# Patient Record
Sex: Female | Born: 1955 | Race: White | Hispanic: No | Smoking: Former smoker
Health system: Southern US, Community
[De-identification: ages and names within clinical notes are randomized; demographics above are authoritative.]

## PROBLEM LIST (undated history)

## (undated) DIAGNOSIS — M1990 Unspecified osteoarthritis, unspecified site: Secondary | ICD-10-CM

## (undated) DIAGNOSIS — E039 Hypothyroidism, unspecified: Secondary | ICD-10-CM

## (undated) DIAGNOSIS — I10 Essential (primary) hypertension: Secondary | ICD-10-CM

## (undated) DIAGNOSIS — M5412 Radiculopathy, cervical region: Secondary | ICD-10-CM

## (undated) DIAGNOSIS — N3281 Overactive bladder: Secondary | ICD-10-CM

## (undated) DIAGNOSIS — F39 Unspecified mood [affective] disorder: Secondary | ICD-10-CM

## (undated) DIAGNOSIS — T7840XA Allergy, unspecified, initial encounter: Secondary | ICD-10-CM

## (undated) DIAGNOSIS — J449 Chronic obstructive pulmonary disease, unspecified: Secondary | ICD-10-CM

## (undated) DIAGNOSIS — K219 Gastro-esophageal reflux disease without esophagitis: Secondary | ICD-10-CM

## (undated) DIAGNOSIS — C8590 Non-Hodgkin lymphoma, unspecified, unspecified site: Secondary | ICD-10-CM

## (undated) DIAGNOSIS — I2510 Atherosclerotic heart disease of native coronary artery without angina pectoris: Secondary | ICD-10-CM

## (undated) DIAGNOSIS — E119 Type 2 diabetes mellitus without complications: Secondary | ICD-10-CM

## (undated) DIAGNOSIS — E785 Hyperlipidemia, unspecified: Secondary | ICD-10-CM

## (undated) HISTORY — DX: Hyperlipidemia, unspecified: E78.5

## (undated) HISTORY — DX: Gastro-esophageal reflux disease without esophagitis: K21.9

## (undated) HISTORY — PX: CARDIAC CATHETERIZATION: SHX172

## (undated) HISTORY — PX: ABDOMINAL HYSTERECTOMY: SHX81

## (undated) HISTORY — DX: Unspecified mood (affective) disorder: F39

## (undated) HISTORY — DX: Hypothyroidism, unspecified: E03.9

## (undated) HISTORY — DX: Radiculopathy, cervical region: M54.12

## (undated) HISTORY — PX: LASIK: SHX215

## (undated) HISTORY — PX: CHOLECYSTECTOMY: SHX55

## (undated) HISTORY — DX: Atherosclerotic heart disease of native coronary artery without angina pectoris: I25.10

## (undated) HISTORY — DX: Unspecified osteoarthritis, unspecified site: M19.90

## (undated) HISTORY — DX: Chronic obstructive pulmonary disease, unspecified: J44.9

## (undated) HISTORY — DX: Allergy, unspecified, initial encounter: T78.40XA

## (undated) HISTORY — DX: Essential (primary) hypertension: I10

## (undated) HISTORY — PX: TRIGGER FINGER RELEASE: SHX641

## (undated) HISTORY — PX: LIPOSUCTION: SHX10

## (undated) HISTORY — DX: Overactive bladder: N32.81

## (undated) HISTORY — PX: EYE SURGERY: SHX253

---

## 2006-04-06 DIAGNOSIS — C8190 Hodgkin lymphoma, unspecified, unspecified site: Secondary | ICD-10-CM | POA: Insufficient documentation

## 2006-04-06 HISTORY — DX: Hodgkin lymphoma, unspecified, unspecified site: C81.90

## 2015-06-01 DIAGNOSIS — Z8572 Personal history of non-Hodgkin lymphomas: Secondary | ICD-10-CM | POA: Insufficient documentation

## 2015-06-01 DIAGNOSIS — M792 Neuralgia and neuritis, unspecified: Secondary | ICD-10-CM | POA: Insufficient documentation

## 2018-04-20 DIAGNOSIS — C8108 Nodular lymphocyte predominant Hodgkin lymphoma, lymph nodes of multiple sites: Secondary | ICD-10-CM | POA: Insufficient documentation

## 2020-09-03 ENCOUNTER — Ambulatory Visit: Payer: Self-pay | Admitting: Internal Medicine

## 2020-09-24 ENCOUNTER — Ambulatory Visit: Payer: Self-pay | Admitting: Adult Health

## 2020-10-14 ENCOUNTER — Other Ambulatory Visit: Payer: Self-pay | Admitting: Family Medicine

## 2020-10-14 DIAGNOSIS — Z1231 Encounter for screening mammogram for malignant neoplasm of breast: Secondary | ICD-10-CM

## 2020-12-19 ENCOUNTER — Other Ambulatory Visit: Payer: Self-pay | Admitting: Pediatrics

## 2020-12-19 ENCOUNTER — Other Ambulatory Visit: Payer: Self-pay | Admitting: Family Medicine

## 2020-12-19 DIAGNOSIS — Z1231 Encounter for screening mammogram for malignant neoplasm of breast: Secondary | ICD-10-CM

## 2021-01-02 ENCOUNTER — Other Ambulatory Visit: Payer: Self-pay | Admitting: Family Medicine

## 2021-01-02 ENCOUNTER — Ambulatory Visit
Admission: RE | Admit: 2021-01-02 | Discharge: 2021-01-02 | Disposition: A | Discharge status: Home / Self Care | Payer: Medicare Other | Source: Ambulatory Visit | Attending: Family Medicine | Admitting: Family Medicine

## 2021-01-02 DIAGNOSIS — M5412 Radiculopathy, cervical region: Secondary | ICD-10-CM

## 2021-01-02 DIAGNOSIS — M25512 Pain in left shoulder: Secondary | ICD-10-CM

## 2021-01-02 DIAGNOSIS — M25511 Pain in right shoulder: Secondary | ICD-10-CM

## 2021-01-15 ENCOUNTER — Other Ambulatory Visit: Payer: Self-pay

## 2021-01-15 ENCOUNTER — Ambulatory Visit: Payer: Medicare Other | Attending: Orthopaedic Surgery | Admitting: Physical Therapy

## 2021-01-15 ENCOUNTER — Encounter: Payer: Self-pay | Admitting: Physical Therapy

## 2021-01-15 DIAGNOSIS — M25512 Pain in left shoulder: Secondary | ICD-10-CM | POA: Diagnosis present

## 2021-01-15 DIAGNOSIS — M6281 Muscle weakness (generalized): Secondary | ICD-10-CM | POA: Insufficient documentation

## 2021-01-15 DIAGNOSIS — G8929 Other chronic pain: Secondary | ICD-10-CM | POA: Diagnosis present

## 2021-01-15 DIAGNOSIS — M25511 Pain in right shoulder: Secondary | ICD-10-CM | POA: Diagnosis present

## 2021-01-15 DIAGNOSIS — R262 Difficulty in walking, not elsewhere classified: Secondary | ICD-10-CM | POA: Diagnosis present

## 2021-01-15 DIAGNOSIS — M542 Cervicalgia: Secondary | ICD-10-CM | POA: Diagnosis not present

## 2021-01-15 NOTE — Patient Instructions (Signed)
Access Code: [email or web address] URL: [email or web address] Date: 01/15/2021 Prepared by: Rivka Barbara  Exercises Supine Shoulder Flexion with Dowel - 1 x daily - 7 x weekly - 1 sets - 10 reps - 5 second hold Seated Scapular Retraction - 1 x daily - 7 x weekly - 2 sets - 10 reps Seated Levator Scapulae Stretch - 1 x daily - 7 x weekly - 3 sets - 45 hold Seated Upper Trapezius Stretch - 1 x daily - 7 x weekly - 3 sets - 45 sec hold

## 2021-01-15 NOTE — Therapy (Addendum)
[place] MAIN [facility] SERVICES [address] [place], [place], [address] Phone: [phone]   Fax:  [phone]  Physical Therapy Evaluation  Patient Details  Name: [place] Floyd MRN: [id number] Date of Birth: Aug 22, 1955 Referring Provider (PT): Starling Manns, MD   Encounter Date: 01/15/2021   PT End of Session - 01/15/21 1351     Visit Number 1    Number of Visits 17    Date for PT Re-Evaluation 03/26/21    Authorization Time Period 01/15/21-03/26/21    Progress Note Due on Visit 10    PT Start Time 1303    PT Stop Time 1358    PT Time Calculation (min) 55 min    Activity Tolerance Patient tolerated treatment well;No increased pain             History reviewed. No pertinent past medical history.  History reviewed. No pertinent surgical history.  There were no vitals filed for this visit.    Subjective Assessment - 01/15/21 1305     Subjective Pt reports pain in her neck and back radiating into her shoulders bilaterally. Pt reports she had MRI in December and she was told she had a torn rotator cuff and was again told later by different MD she did not have rotator cuff tear. Pt reports she normally sleeps with right arm under pillow and this causes pain now, pt also reports she has difficulty getting objects out of shelves that are high due to pain. Pt reports her husbend recently passed away following 36 years of marriage. 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    Patient is accompained by: Family member    Pertinent History Pt reports pain in her neck and back radiating into her shoulders bilaterally. Pt reports she had MRI in December and she was told she had a torn rotator cuff and was again told later by different MD she did not have rotator cuff tear. Pt reports she normally sleeps with right arm under pillow and this causes pain now, pt also reports she has difficulty  getting objects out of shelves that are high due to pain. Pt reports her husbend recently passed away following 33 years of marriage. 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    Limitations Other (comment)   computer use for longer periods causes increase in pain   How long can you sit comfortably? 1 hour (computer use)    Currently in Pain? Yes    Pain Location Neck               [facility] PT Assessment - 01/22/21 0001       Assessment   Medical Diagnosis Neck Pain    Referring Provider (PT) Starling Manns, MD    Onset Date/Surgical Date 08/05/03    Hand Dominance Right      Balance Screen   Has the patient fallen in the past 6 months No    Has the patient had a decrease in activity level because of a fear of falling?  No    Is the patient reluctant to leave their home because of a fear of falling?  No      Observation/Other Assessments   Focus on Therapeutic Outcomes (FOTO)  50      AROM   Right Shoulder Flexion 135 Degrees  Right Shoulder ABduction 127 Degrees    Left Shoulder Flexion 138 Degrees    Left Shoulder ABduction 140 Degrees      Strength   Right Shoulder Flexion 4/5    Right Shoulder ABduction 4/5    Right Shoulder Internal Rotation 4+/5    Right Shoulder External Rotation 4/5    Left Shoulder Flexion 4/5    Left Shoulder Extension 4+/5    Left Shoulder ABduction 4/5    Left Shoulder Internal Rotation 4+/5    Left Shoulder External Rotation 4/5    Right Elbow Flexion 5/5    Right Elbow Extension 5/5    Left Elbow Flexion 5/5    Left Elbow Extension 5/5      Flexibility   Soft Tissue Assessment /Muscle Length no               Further assessment notes: Patient had moderate to severe trigger points in levator scapula, upper traps, and suboccipital muscles bilaterally.  Did not report any improvement in signs and symptoms with cervical distraction, however patient is finishing up steroid pack  and this may be influencing her levels of pain at this time.  Patient did not report any pain levels coming into therapy but did have some discomfort with palpation of trigger points as well as with shoulder range of motion as demonstrated in this exam.   Therex: Bilateral upper trap stretch x45 seconds each side Cues for proper positioning  Bilateral levator scapula stretch x45 seconds each side -Used to prepositioning and hold times  Active assisted range of motion using wand for flexion in supine position 10 times for 5-second holds cues for proper positioning and to ensure patient was not pushing into levels of pain -Patient reports improved shoulder range of motion following this exercise  Scapular retraction 2 x 10 x 5-second holds -In order to improve patient sitting posture and tolerance for sitting          Objective measurements completed on examination: See above findings.                PT Education - 01/15/21 1351     Education Details Access Code: [email or web address]  URL: [email or web address]  Date: 01/15/2021  Prepared by: Rivka Barbara    Exercises  Supine Shoulder Flexion with Dowel - 1 x daily - 7 x weekly - 1 sets - 10 reps - 5 second hold  Seated Scapular Retraction - 1 x daily - 7 x weekly - 2 sets - 10 reps  Seated Levator Scapulae Stretch - 1 x daily - 7 x weekly - 3 sets - 45 hold  Seated Upper Trapezius Stretch - 1 x daily - 7 x weekly - 3 sets - 45 sec hold    Person(s) Educated Patient    Methods Explanation;Handout    Comprehension Verbalized understanding;Returned demonstration              PT Short Term Goals - 01/15/21 1645       PT SHORT TERM GOAL #1   Title Patient will improve shoulder AROM to > 140 degrees of flexion, scaption, and abduction for improved ability to perform overhead activities.    Baseline See flowsheet    Time 4    Period Weeks    Status New    Target Date 02/12/21      PT SHORT TERM GOAL #2    Title Patient will increase FOTO score to equal to or greater than 54    to  demonstrate statistically significant improvement in mobility and quality of life.    Baseline 50 at IE    Time 4    Period Weeks    Target Date 02/12/21               PT Long Term Goals - 01/15/21 1648       PT LONG TERM GOAL #1   Title Patient will increase FOTO score to equal to or greater than  58   to demonstrate statistically significant improvement in mobility and quality of life.    Baseline 50    Time 8    Period Weeks    Status New    Target Date 02/12/21      PT LONG TERM GOAL #2   Title Pt will report neck and shoudler pain no greater than 2/10 for 1 week in order to indicate improved pain levels    Baseline Patient has moderate pain with sitting at computer for longer than an hour and frequently has to change positions.    Time 8    Period Weeks    Status New    Target Date 03/12/21      PT LONG TERM GOAL #3   Title Patient will report ability to perform all housework involving overhead reaching in order to improve her function with ADLs    Baseline And ability to perform housework involving overhead reaching above approximately 130 degrees of shoulder flexion    Time 8    Period Weeks    Status New                    Plan - 01/15/21 1353     Clinical Impression Statement Pt is 65 y.o. female who presents to physical therapy with c/o neck and shoulder pain. Pt reports neck pain occassionally radiates into th eback of her head causing headaches. Pt has deficits in shoulder range of motion and strength bilaterally and has deficits in neck range of motion with sidebending in additions to rounded shoulder posture. Pt also has numerous trigger points in her upper traps, levator scapulae and subocciptal muscles. Pt has acivity limitations in her ability to work on or use her copmuter for more than an hour or complete other activities involving prolonged sitting posture. Pt will  benefit from skilled physical therapy intervention in order to improve her shoudler range of motion and strength, and to improve her neck pain in order to improve her function and her quality of life.    Personal Factors and Comorbidities Age;Time since onset of injury/illness/exacerbation    Examination-Activity Limitations Reach Overhead;Lift;Sit    Examination-Participation Restrictions Driving;Other   Computer work   [employer] Stable/Uncomplicated    Clinical Decision Making Low    Rehab Potential Good    PT Frequency 2x / week    PT Duration Other (comment)   10 weeks   PT Treatment/Interventions ADLs/Self Care Home Management;Therapeutic activities;Therapeutic exercise;Neuromuscular re-education;Dry needling;Passive range of motion;Joint Manipulations;Spinal Manipulations;Manual techniques    PT Next Visit Plan Review HEP. manual therapy to imrove UT, levator and subocippital muscular trigger points    PT Home Exercise Plan Access Code: [email or web address]  URL: [email or web address]             Patient will benefit from skilled therapeutic intervention in order to improve the following deficits and impairments:  Decreased activity tolerance, Impaired flexibility, Impaired UE functional use, Improper body mechanics, Postural dysfunction, Pain  Visit Diagnosis: Cervicalgia - Plan: PT plan  of care cert/re-cert  Chronic right shoulder pain - Plan: PT plan of care cert/re-cert  Chronic left shoulder pain - Plan: PT plan of care cert/re-cert     Problem List There are no problems to display for this patient.   Particia Lather, PT 01/15/2021, 5:03 PM  [place] MAIN [facility] SERVICES [address] [place], [place], [address] Phone: [phone]   Fax:  [phone]  Name: Virginia Floyd MRN: [id number] Date of Birth: 30-May-1955

## 2021-01-20 ENCOUNTER — Other Ambulatory Visit: Payer: Self-pay

## 2021-01-20 ENCOUNTER — Ambulatory Visit: Payer: Medicare Other

## 2021-01-20 DIAGNOSIS — M542 Cervicalgia: Secondary | ICD-10-CM

## 2021-01-20 DIAGNOSIS — M25512 Pain in left shoulder: Secondary | ICD-10-CM

## 2021-01-20 DIAGNOSIS — M25511 Pain in right shoulder: Secondary | ICD-10-CM

## 2021-01-20 DIAGNOSIS — G8929 Other chronic pain: Secondary | ICD-10-CM

## 2021-01-20 NOTE — Therapy (Signed)
[place] MAIN [facility] SERVICES [address] [place], [place], [address] Phone: [phone]   Fax:  [phone]  Physical Therapy Treatment  Patient Details  Name: [place] Floyd MRN: [id number] Date of Birth: 17-May-1955 Referring Provider (PT): Starling Manns, MD   Encounter Date: 01/20/2021   PT End of Session - 01/20/21 1319     Visit Number 2    Number of Visits 17    Date for PT Re-Evaluation 03/26/21    Authorization Type Medicare    Authorization Time Period 01/15/21-03/26/21    PT Start Time 1302    PT Stop Time 1342    PT Time Calculation (min) 40 min    Activity Tolerance Patient tolerated treatment well;No increased pain             No past medical history on file.  No past surgical history on file.  There were no vitals filed for this visit.   Subjective Assessment - 01/20/21 1307     Subjective Pt doing well today, had a busy weekend, company in from far away, but did not notice any significant imcrease in her pain. She did not have time to attempt her HEP.    Pertinent History Pt reports pain in her neck and back radiating into her shoulders bilaterally. Pt reports she had MRI in December and she was told she had a torn rotator cuff and was again told later by different MD she did not have rotator cuff tear. Pt reports she normally sleeps with right arm under pillow and this causes pain now, pt also reports she has difficulty getting objects out of shelves that are high due to pain. Pt reports her husband recently passed away following 82 years of marriage. 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    Currently in Pain? Yes    Pain Score 3     Pain Location --   bilat cervical extensors C4-C6 level             INTERVENTION -Supine wand flexion on wedge 1x20x3sec H No pain, excellent ROM -Cervical lateral flexion stretch  DC'd due to hypermobility in ROM  and no subjective tissue stretch  -Cervical rotation stretch  DC'd due to hypermobility in ROM and no subjective tissue stretch  -seated nose to armpit stretch 2x30sec bilat (subjective stretch just of posterior rib 1, no pain)  -seated scapular retraction isometrics, 15x5secH, cues for neutral head/neck  -seated BUE GHJ ER elbows at 90 degrees, hands supinated 1x15x3secH (tied to Rt hand due to grip weakness) -standing BUE shoulder ABD to 90 1lb FW each 1x15 -standing BUE shoulder flexion to 90 1lb FW each   -seated BUE GHJ ER elbows at 90 degrees, hands supinated 1x15x3secH (tied to Rt hand due to grip weakness) -standing BUE shoulder ABD to 90 1lb FW each 1x15 -standing BUE shoulder flexion to 90 1lb FW each (moved to scaption for this set with successful reduction of left GHJ joint noise)   -standing back to wall, cervication retraction into 1 pillow 15x3secH     PT Education - 01/20/21 1318     Education Details Form for therex this date.    Person(s) Educated Patient    Methods Explanation;Demonstration    Comprehension Verbalized understanding              PT Short Term Goals - 01/15/21 1645  PT SHORT TERM GOAL #1   Title Patient will improve shoulder AROM to > 140 degrees of flexion, scaption, and abduction for improved ability to perform overhead activities.    Baseline See flowsheet    Time 4    Period Weeks    Status New    Target Date 02/12/21      PT SHORT TERM GOAL #2   Title Patient will increase FOTO score to equal to or greater than 54    to demonstrate statistically significant improvement in mobility and quality of life.    Baseline 50 at IE    Time 4    Period Weeks    Target Date 02/12/21               PT Long Term Goals - 01/15/21 1648       PT LONG TERM GOAL #1   Title Patient will increase FOTO score to equal to or greater than  58   to demonstrate statistically significant improvement in mobility and quality of life.     Baseline 50    Time 8    Period Weeks    Status New    Target Date 02/12/21      PT LONG TERM GOAL #2   Title Pt will report neck and shoudler pain no greater than 2/10 for 1 week in order to indicate improved pain levels    Baseline Patient has moderate pain with sitting at computer for longer than an hour and frequently has to change positions.    Time 8    Period Weeks    Status New    Target Date 03/12/21      PT LONG TERM GOAL #3   Title Patient will report ability to perform all housework involving overhead reaching in order to improve her function with ADLs    Baseline And ability to perform housework involving overhead reaching above approximately 130 degrees of shoulder flexion    Time 8    Period Weeks    Status New                   Plan - 01/20/21 1320     Clinical Impression Statement Continued working toward gentle cervical soft tissue work and shoulder motor activation. All interventions tolerated as expected by author. Mobility and strength continue to progress as anticipated. Recovery intervals given as needed based on signs of exertion and/or pt request. Pt educated on best technique for each intervention- author uses verbal, visual, tactile cues to optimize learning. Author takes steps to maximize patient independence when appropriate. Pt remains highly motivated. Pt closely monitored throughout session for safe activity response, as well as to maximize patient safety during interventions. The patient's therapy prognosis indicates continued potential for improvement, anticipate that future progress is attainable in a reasonable/predictable timeframe. Maximum improvement is within reach. Pt will continue to benefit from skilled PT services to address deficits and impairment identified in evaluation in order to maximize independence and safety in basic mobility required for performance of ADL, IADL, and leisure.    Personal Factors and Comorbidities Comorbidity 1     Examination-Activity Limitations Reach Overhead;Lift;Sit    Examination-Participation Restrictions Driving;Other    Stability/Clinical Decision Making Stable/Uncomplicated    Clinical Decision Making Low    Rehab Potential Good    PT Frequency 2x / week    PT Duration Other (comment)   10 weeks   PT Treatment/Interventions ADLs/Self Care Home Management;Therapeutic activities;Therapeutic exercise;Neuromuscular re-education;Dry needling;Passive  range of motion;Joint Manipulations;Spinal Manipulations;Manual techniques    PT Next Visit Plan Reassess suboccipitals, scapular retraction to mobilize lower cervical spine, band row, continue with shoulder   PT Home Exercise Plan Access Code: [email or web address]  URL: [email or web address]    Consulted and Agree with Plan of Care Patient             Patient will benefit from skilled therapeutic intervention in order to improve the following deficits and impairments:  Decreased activity tolerance, Impaired flexibility, Impaired UE functional use, Improper body mechanics, Postural dysfunction, Pain  Visit Diagnosis: Cervicalgia  Chronic right shoulder pain  Chronic left shoulder pain     Problem List There are no problems to display for this patient.  1:36 PM, 01/20/21 Etta Grandchild, PT, DPT Physical Therapist - [facility]  Outpatient Physical Therapy- [place] [phone]     [place], [place] 01/20/2021, 1:24 PM  [place] MAIN [facility] SERVICES [address] [place], [place], [address] Phone: [phone]   Fax:  [phone]  Name: Etherine Mackowiak MRN: [id number] Date of Birth: 1955-12-04

## 2021-01-22 ENCOUNTER — Ambulatory Visit
Admission: RE | Admit: 2021-01-22 | Discharge: 2021-01-22 | Disposition: A | Discharge status: Home / Self Care | Payer: Medicare Other | Source: Ambulatory Visit | Attending: Family Medicine | Admitting: Family Medicine

## 2021-01-22 ENCOUNTER — Other Ambulatory Visit: Payer: Self-pay

## 2021-01-22 DIAGNOSIS — Z1231 Encounter for screening mammogram for malignant neoplasm of breast: Secondary | ICD-10-CM

## 2021-01-23 ENCOUNTER — Ambulatory Visit: Payer: Medicare Other | Admitting: Physical Therapy

## 2021-01-23 DIAGNOSIS — M542 Cervicalgia: Secondary | ICD-10-CM

## 2021-01-23 DIAGNOSIS — G8929 Other chronic pain: Secondary | ICD-10-CM

## 2021-01-23 DIAGNOSIS — M25512 Pain in left shoulder: Secondary | ICD-10-CM

## 2021-01-23 NOTE — Therapy (Signed)
[place] MAIN [facility] SERVICES [address] [place], [place], [address] Phone: [phone]   Fax:  [phone]  Physical Therapy Treatment  Patient Details  Name: [place] Floyd MRN: [id number] Date of Birth: 05-31-1955 Referring Provider (PT): Starling Manns, MD   Encounter Date: 01/23/2021   PT End of Session - 01/23/21 1507     Visit Number 3    Number of Visits 17    Date for PT Re-Evaluation 03/26/21    Authorization Type Medicare    Authorization Time Period 01/15/21-03/26/21    PT Start Time 1510    PT Stop Time 1550    PT Time Calculation (min) 40 min    Activity Tolerance Patient tolerated treatment well;No increased pain             No past medical history on file.  No past surgical history on file.  There were no vitals filed for this visit.   Subjective Assessment - 01/23/21 1507     Subjective Pt reports increased s/s of headache from her last session. Reports she has tried some of her HEP.    Pertinent History Pt reports pain in her neck and back radiating into her shoulders bilaterally. Pt reports she had MRI in December and she was told she had a torn rotator cuff and was again told later by different MD she did not have rotator cuff tear. Pt reports she normally sleeps with right arm under pillow and this causes pain now, pt also reports she has difficulty getting objects out of shelves that are high due to pain. Pt reports her husband recently passed away following 37 years of marriage. 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    How long can you sit comfortably? 1 hour (computer use)                    Treatment provided this session  Therex:   Cervical retraction utilizing BP cuff for biofeedback of correct muscle activation, pt able to consistently achieve 10-15 mmhg on BP cuff -BP cuff pumped to inflation and rolled  -2x10x5sec -To improve  cervical flexor strength for posture activities  Shoulder flexion in supine with 1# hand weight to improve shoulder strength and mobility 1 x 10 bilateral  1 x 10 with YTB -rated easy   Bilateral shoulder extension -2 x 10, 1st set RTB, second set GTB -To improve shoulder strength and mobility  Bilateral hor shoulder ABD  -RTB 2 x 10  -cues for hand and shoulder placement  -To target mid trap strength and thoracic extensor strength.  TRX Rows: 2 x 10  -cues for foot placement and distance from anchor point  -cues for proper posture in the shoulders and neck      Manual Therapy:  STM to subocippitals, numerous trigger points noted  Suboccipital release 2 x 1 min ea -subjective report of decresed headache s/s following  -Patient had no complaints of headache following manual therapy and physical therapy session this date   Pt educated throughout session about proper posture and technique with exercises. Improved exercise technique, movement at target joints, use of target muscles after min to mod verbal, visual, tactile cues.                     PT Education - 01/23/21 1654     Education Details Added weighted shoulder flexion with  1 pound shoulder weight, shoulder extensions, horizontal shoulder abduction, to patient's home exercise program via handout    Person(s) Educated Patient    Methods Explanation;Handout    Comprehension Verbalized understanding;Returned demonstration              PT Short Term Goals - 01/15/21 1645       PT SHORT TERM GOAL #1   Title Patient will improve shoulder AROM to > 140 degrees of flexion, scaption, and abduction for improved ability to perform overhead activities.    Baseline See flowsheet    Time 4    Period Weeks    Status New    Target Date 02/12/21      PT SHORT TERM GOAL #2   Title Patient will increase FOTO score to equal to or greater than 54    to demonstrate statistically significant improvement in  mobility and quality of life.    Baseline 50 at IE    Time 4    Period Weeks    Target Date 02/12/21               PT Long Term Goals - 01/15/21 1648       PT LONG TERM GOAL #1   Title Patient will increase FOTO score to equal to or greater than  58   to demonstrate statistically significant improvement in mobility and quality of life.    Baseline 50    Time 8    Period Weeks    Status New    Target Date 02/12/21      PT LONG TERM GOAL #2   Title Pt will report neck and shoudler pain no greater than 2/10 for 1 week in order to indicate improved pain levels    Baseline Patient has moderate pain with sitting at computer for longer than an hour and frequently has to change positions.    Time 8    Period Weeks    Status New    Target Date 03/12/21      PT LONG TERM GOAL #3   Title Patient will report ability to perform all housework involving overhead reaching in order to improve her function with ADLs    Baseline And ability to perform housework involving overhead reaching above approximately 130 degrees of shoulder flexion    Time 8    Period Weeks    Status New                   Plan - 01/23/21 1508     Clinical Impression Statement Patient continues to demonstrate good motivation to complete home exercise program and physical therapy program on this date.  Continue with the shoulder strengthening exercises, postural strengthening exercises, in order to improve patient's levels of pain and discomfort.  Patient did report decreased levels of headache following manual therapy exercises performed during physical therapy session today.    Personal Factors and Comorbidities Comorbidity 1    Examination-Activity Limitations Reach Overhead;Lift;Sit    Examination-Participation Restrictions Driving;Other    Stability/Clinical Decision Making Stable/Uncomplicated    Rehab Potential Good    PT Frequency 2x / week    PT Duration Other (comment)   10 weeks   PT  Treatment/Interventions ADLs/Self Care Home Management;Therapeutic activities;Therapeutic exercise;Neuromuscular re-education;Dry needling;Passive range of motion;Joint Manipulations;Spinal Manipulations;Manual techniques    PT Next Visit Plan Review HEP. manual therapy to imrove UT, levator and subocippital muscular trigger points    PT Home Exercise Plan Access Code: [email or web address]  URL:  [email or web address]    Consulted and Agree with Plan of Care Patient             Patient will benefit from skilled therapeutic intervention in order to improve the following deficits and impairments:  Decreased activity tolerance, Impaired flexibility, Impaired UE functional use, Improper body mechanics, Postural dysfunction, Pain  Visit Diagnosis: No diagnosis found.     Problem List There are no problems to display for this patient.   Particia Lather, PT 01/23/2021, 5:06 PM  [place] MAIN [facility] SERVICES [address] [place], [place], [address] Phone: [phone]   Fax:  [phone]  Name: Nataleah Scioneaux MRN: [id number] Date of Birth: [birth year]

## 2021-01-27 ENCOUNTER — Other Ambulatory Visit: Payer: Self-pay

## 2021-01-27 ENCOUNTER — Ambulatory Visit: Payer: Medicare Other | Admitting: Physical Therapy

## 2021-01-27 DIAGNOSIS — M542 Cervicalgia: Secondary | ICD-10-CM | POA: Diagnosis not present

## 2021-01-27 DIAGNOSIS — R262 Difficulty in walking, not elsewhere classified: Secondary | ICD-10-CM

## 2021-01-27 DIAGNOSIS — G8929 Other chronic pain: Secondary | ICD-10-CM

## 2021-01-27 DIAGNOSIS — M6281 Muscle weakness (generalized): Secondary | ICD-10-CM

## 2021-01-27 NOTE — Therapy (Signed)
[place] MAIN [facility] SERVICES [address] [place], [place], [address] Phone: [phone]   Fax:  [phone]  Physical Therapy Treatment  Patient Details  Name: Virginia Floyd MRN: [id number] Date of Birth: 12-Sep-1955 Referring Provider (PT): Starling Manns, MD   Encounter Date: 01/27/2021   PT End of Session - 01/27/21 1022     Visit Number 4    Number of Visits 17    Date for PT Re-Evaluation 03/26/21    Authorization Type Medicare    Authorization Time Period 01/15/21-03/26/21    PT Start Time 1018    PT Stop Time 1100    PT Time Calculation (min) 42 min    Activity Tolerance Patient tolerated treatment well;No increased pain             No past medical history on file.  No past surgical history on file.  There were no vitals filed for this visit.   Subjective Assessment - 01/27/21 1021     Subjective Pt reports she has had some headaches off and on since last visit.  She notes she has been battling a cold since Friday, so she was unable to perform her HEP.    Pertinent History Pt reports pain in her neck and back radiating into her shoulders bilaterally. Pt reports she had MRI in December and she was told she had a torn rotator cuff and was again told later by different MD she did not have rotator cuff tear. Pt reports she normally sleeps with right arm under pillow and this causes pain now, pt also reports she has difficulty getting objects out of shelves that are high due to pain. Pt reports her husband recently passed away following 18 years of marriage. 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    How long can you sit comfortably? 1 hour (computer use)    Currently in Pain? Yes    Pain Score 3     Pain Location Neck    Pain Orientation Left               Therex:    Cervical retraction into pillow with verbal cues for correct muscle  activation -2x10x5sec -To improve cervical flexor strength for posture activities   Shoulder flexion in supine with RTB to improve shoulder strength and mobility 2x10 bilateral     Rhythmic Stabilization progressing to distal forces being applied, 60 sec bouts Rhythmic stabilization with eyes closed,     Bilateral shoulder extension - 2x10, 1st set RTB, second set GTB -To improve shoulder strength and mobility   Bilateral shoulder ABD  -RTB 2x10  -cues for hand and shoulder placement  -To target mid trap strength and thoracic extensor strength.  Bilateral rhythmic stabilization in forward flexion/abduction -RTB, 30 sec bouts -cues for technique and performance of exercise   TRX Rows: 2 x 10  -cues for foot placement and distance from anchor point  -cues for proper posture in the shoulders and neck      Manual Therapy:   STM to subocippitals, numerous trigger points noted   Suboccipital release 2x3 min ea -pt reports reduction in painful region following treatment  Supine UT stretch applied bilaterally, 30 sec bouts x2 each side Supine cervical rotation with slight overpressure applied by therapist, 30 sec bouts, x2 each side    Pt educated throughout session about proper posture and technique with exercises. Improved exercise  technique, movement at target joints, use of target muscles after min to mod verbal, visual, tactile cues.          PT Short Term Goals - 01/15/21 1645       PT SHORT TERM GOAL #1   Title Patient will improve shoulder AROM to > 140 degrees of flexion, scaption, and abduction for improved ability to perform overhead activities.    Baseline See flowsheet    Time 4    Period Weeks    Status New    Target Date 02/12/21      PT SHORT TERM GOAL #2   Title Patient will increase FOTO score to equal to or greater than 54    to demonstrate statistically significant improvement in mobility and quality of life.    Baseline 50 at IE    Time 4     Period Weeks    Target Date 02/12/21               PT Long Term Goals - 01/15/21 1648       PT LONG TERM GOAL #1   Title Patient will increase FOTO score to equal to or greater than  58   to demonstrate statistically significant improvement in mobility and quality of life.    Baseline 50    Time 8    Period Weeks    Status New    Target Date 02/12/21      PT LONG TERM GOAL #2   Title Pt will report neck and shoudler pain no greater than 2/10 for 1 week in order to indicate improved pain levels    Baseline Patient has moderate pain with sitting at computer for longer than an hour and frequently has to change positions.    Time 8    Period Weeks    Status New    Target Date 03/12/21      PT LONG TERM GOAL #3   Title Patient will report ability to perform all housework involving overhead reaching in order to improve her function with ADLs    Baseline And ability to perform housework involving overhead reaching above approximately 130 degrees of shoulder flexion    Time 8    Period Weeks    Status New                    Patient will benefit from skilled therapeutic intervention in order to improve the following deficits and impairments:     Visit Diagnosis: Difficulty in walking, not elsewhere classified  Muscle weakness (generalized)  Cervicalgia  Chronic left shoulder pain  Chronic right shoulder pain     Problem List There are no problems to display for this patient.   Gwenlyn Saran, PT, DPT 01/27/21, 1:50 PM   Christie Nottingham, PT 01/27/2021, 10:22 AM  [place] MAIN [facility] SERVICES [address] [place], [place], [address] Phone: [phone]   Fax:  [phone]  Name: Virginia Floyd MRN: [id number] Date of Birth: 10-Aug-1955

## 2021-01-29 ENCOUNTER — Ambulatory Visit: Payer: Medicare Other | Admitting: Physical Therapy

## 2021-01-29 ENCOUNTER — Other Ambulatory Visit: Payer: Self-pay

## 2021-01-29 DIAGNOSIS — M542 Cervicalgia: Secondary | ICD-10-CM | POA: Diagnosis not present

## 2021-01-29 DIAGNOSIS — M6281 Muscle weakness (generalized): Secondary | ICD-10-CM

## 2021-01-29 DIAGNOSIS — M25512 Pain in left shoulder: Secondary | ICD-10-CM

## 2021-01-29 DIAGNOSIS — G8929 Other chronic pain: Secondary | ICD-10-CM

## 2021-01-29 NOTE — Therapy (Signed)
[place] MAIN [facility] SERVICES [address] [place], [place], [address] Phone: [phone]   Fax:  [phone]  Physical Therapy Treatment  Patient Details  Name: Virginia Floyd MRN: [id number] Date of Birth: April 04, 1956 Referring Provider (PT): Starling Manns, MD   Encounter Date: 01/29/2021   PT End of Session - 01/29/21 1221     Visit Number 5    Number of Visits 17    Date for PT Re-Evaluation 03/26/21    Authorization Type Medicare    Authorization Time Period 01/15/21-03/26/21    Progress Note Due on Visit 10    PT Start Time 1015    PT Stop Time 1058    PT Time Calculation (min) 43 min    Activity Tolerance Patient tolerated treatment well;No increased pain    Behavior During Therapy [facility] for tasks assessed/performed             No past medical history on file.  No past surgical history on file.  There were no vitals filed for this visit.   Subjective Assessment - 01/29/21 1019     Subjective Pt reports continued copmlaints of headache and has been unable to notice any patterns to her headaches. Pt reports improvement in shoulder function but still has difficulty with sleeping on either side and inability to lay hand under pillow.    Patient is accompained by: Family member    Pertinent History Pt reports pain in her neck and back radiating into her shoulders bilaterally. Pt reports she had MRI in December and she was told she had a torn rotator cuff and was again told later by different MD she did not have rotator cuff tear. Pt reports she normally sleeps with right arm under pillow and this causes pain now, pt also reports she has difficulty getting objects out of shelves that are high due to pain. Pt reports her husband recently passed away following 9 years of marriage. 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    Limitations Other (comment)    How long can you sit  comfortably? 1 hour (computer use)            Treatment provided this session      Therex:    Cervical retraction into BP cuff for feedback on correct movement mechanics,  -2x 10 x 5 sec   Rhythmic Stabilization progressing to distal forces being applied, 60 sec bouts Rhythmic stabilization with eyes closed,       Bilateral shoulder extension - 2x10, 1st set RTB, second set GTB -To improve shoulder strength and mobility   Bilateral shoulder ABD  -GTB 2x10  -cues for hand and shoulder placement  -To target mid trap strength and thoracic extensor strength.   TRX Rows: 2 x 10  -cues for foot placement and distance from anchor point  -cues for proper posture in the shoulders and neck      Manual Therapy:   STM to subocippitals, numerous trigger points noted   Suboccipital release 2x3 min ea -pt reports reduction in painful region following treatment   -Left upper trap release 2 x 1 minute, patient noted improvement in left upper trap pain with palpation following with upper trap release  Suboccipital and bilateral upper trap trigger point releases as well as soft tissue mobilization applied.   Pt educated throughout session about proper posture and technique with exercises. Improved exercise technique, movement at  target joints, use of target muscles after min to mod verbal, visual, tactile cues                                PT Short Term Goals - 01/15/21 1645       PT SHORT TERM GOAL #1   Title Patient will improve shoulder AROM to > 140 degrees of flexion, scaption, and abduction for improved ability to perform overhead activities.    Baseline See flowsheet    Time 4    Period Weeks    Status New    Target Date 02/12/21      PT SHORT TERM GOAL #2   Title Patient will increase FOTO score to equal to or greater than 54    to demonstrate statistically significant improvement in mobility and quality of life.    Baseline 50 at IE     Time 4    Period Weeks    Target Date 02/12/21               PT Long Term Goals - 01/15/21 1648       PT LONG TERM GOAL #1   Title Patient will increase FOTO score to equal to or greater than  58   to demonstrate statistically significant improvement in mobility and quality of life.    Baseline 50    Time 8    Period Weeks    Status New    Target Date 02/12/21      PT LONG TERM GOAL #2   Title Pt will report neck and shoudler pain no greater than 2/10 for 1 week in order to indicate improved pain levels    Baseline Patient has moderate pain with sitting at computer for longer than an hour and frequently has to change positions.    Time 8    Period Weeks    Status New    Target Date 03/12/21      PT LONG TERM GOAL #3   Title Patient will report ability to perform all housework involving overhead reaching in order to improve her function with ADLs    Baseline And ability to perform housework involving overhead reaching above approximately 130 degrees of shoulder flexion    Time 8    Period Weeks    Status New                   Plan - 01/29/21 1216     Clinical Impression Statement Patient tolerated session well with manual therapy as well as with its continuation of shoulder strengthening stabilization exercises.  Patient still has some tenderness in the suboccipital region as well as in her bilateral upper traps but patient continues to note improvement in symptoms following manual therapy.  Patient to be reassessed next session for improvement in intermittent headaches as well as improvement in right upper trap and suboccipital muscles with tenderness and pain.  Patient continue to benefit from skilled physical therapy intervention in order to improve her deficits in order to allow her to return to her prior level of function.    Personal Factors and Comorbidities Comorbidity 1    Examination-Activity Limitations Reach Overhead;Lift;Sit     Examination-Participation Restrictions Driving;Other    Stability/Clinical Decision Making Stable/Uncomplicated    Rehab Potential Good    PT Frequency 2x / week    PT Duration Other (comment)   10 weeks   PT Treatment/Interventions ADLs/Self  Care Home Management;Therapeutic activities;Therapeutic exercise;Neuromuscular re-education;Dry needling;Passive range of motion;Joint Manipulations;Spinal Manipulations;Manual techniques    PT Next Visit Plan Review HEP. manual therapy to imrove UT, levator and subocippital muscular trigger points    PT Home Exercise Plan Access Code: [email or web address]  URL: [email or web address]    Consulted and Agree with Plan of Care Patient             Patient will benefit from skilled therapeutic intervention in order to improve the following deficits and impairments:  Decreased activity tolerance, Impaired flexibility, Impaired UE functional use, Improper body mechanics, Postural dysfunction, Pain  Visit Diagnosis: Cervicalgia  Chronic left shoulder pain  Chronic right shoulder pain  Muscle weakness (generalized)     Problem List There are no problems to display for this patient.   Particia Lather, PT 01/29/2021, 12:22 PM  [place] MAIN [facility] SERVICES [address] [place], [place], [address] Phone: [phone]   Fax:  [phone]  Name: Virginia Floyd MRN: [id number] Date of Birth: July 31, 1955

## 2021-02-03 ENCOUNTER — Ambulatory Visit: Payer: Medicare Other

## 2021-02-03 ENCOUNTER — Other Ambulatory Visit: Payer: Self-pay

## 2021-02-03 DIAGNOSIS — M542 Cervicalgia: Secondary | ICD-10-CM

## 2021-02-03 DIAGNOSIS — M6281 Muscle weakness (generalized): Secondary | ICD-10-CM

## 2021-02-03 NOTE — Therapy (Signed)
[place] MAIN [facility] SERVICES [address] [place], [place], [address] Phone: [phone]   Fax:  [phone]  Physical Therapy Treatment  Patient Details  Name: Virginia Floyd MRN: [id number] Date of Birth: 15-May-1955 Referring Provider (PT): Starling Manns, MD   Encounter Date: 02/03/2021   PT End of Session - 02/03/21 0919     Visit Number 6    Number of Visits 17    Date for PT Re-Evaluation 03/26/21    Authorization Type Medicare    Authorization Time Period 01/15/21-03/26/21    Progress Note Due on Visit 10    PT Start Time 0922    PT Stop Time 1006    PT Time Calculation (min) 44 min    Activity Tolerance Patient tolerated treatment well;No increased pain    Behavior During Therapy [facility] for tasks assessed/performed             History reviewed. No pertinent past medical history.  History reviewed. No pertinent surgical history.  There were no vitals filed for this visit.   Subjective Assessment - 02/03/21 0920     Subjective Patient reports doing well since last visit, only had 2 or 3 headaches since last visit which she rated as about 4/10. Denies current headache or pain.    Patient is accompained by: Family member    Pertinent History Pt reports pain in her neck and back radiating into her shoulders bilaterally. Pt reports she had MRI in December and she was told she had a torn rotator cuff and was again told later by different MD she did not have rotator cuff tear. Pt reports she normally sleeps with right arm under pillow and this causes pain now, pt also reports she has difficulty getting objects out of shelves that are high due to pain. Pt reports her husband recently passed away following 25 years of marriage. 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    Limitations Other (comment)    How long can you sit comfortably? 1 hour (computer use)    Currently in Pain?  No/denies    Pain Score 0-No pain                  Manual Therapy   Upper trap stretch 2x30sec each side suboccipital release  cervical side bend and rotation stretch 2x30sec each side  Heat pack with active cervical rotation and side bending while SPT provided shoulder stabilization and oscillations.  Ther Ex: Cervical retraction, 1x12, cuing for retraction instead of flexion and chin tuck.  Scapular clocks 2x12, cuing for focus on the scapular retraction and depression with 2-3 sec hold at end.  Shoulder extension GTB, 2x12  BIL ER, RTB 2x12  BIL shoulder horizontal abduction, RTB, 2x12  Ys with 2# weights, 2x10  Seated upper trap and cervical side bend stretch with towel 2x30sec each side  Open book stretch 1x15 each side, pt encouraged to add to HEP before getting out of bed in morning. Pt reported more difficulty with right rotation.     Pt educated throughout session about proper posture and technique with exercises. Improved exercise technique, movement at target joints, use of target muscles after min to mod verbal, visual, tactile cues.         PT Education - 02/03/21 1031     Education Details Pt educated on importance of scapular retraction and depression during Ther Ex, education on  sleep posture with pillow support, and thoracic rotation/open book stretch for increasing mobility.    Person(s) Educated Patient    Methods Explanation;Demonstration;Tactile cues;Verbal cues    Comprehension Returned demonstration;Verbal cues required;Tactile cues required;Verbalized understanding              PT Short Term Goals - 01/15/21 1645       PT SHORT TERM GOAL #1   Title Patient will improve shoulder AROM to > 140 degrees of flexion, scaption, and abduction for improved ability to perform overhead activities.    Baseline See flowsheet    Time 4    Period Weeks    Status New    Target Date 02/12/21      PT SHORT TERM GOAL #2   Title Patient  will increase FOTO score to equal to or greater than 54    to demonstrate statistically significant improvement in mobility and quality of life.    Baseline 50 at IE    Time 4    Period Weeks    Target Date 02/12/21               PT Long Term Goals - 01/15/21 1648       PT LONG TERM GOAL #1   Title Patient will increase FOTO score to equal to or greater than  58   to demonstrate statistically significant improvement in mobility and quality of life.    Baseline 50    Time 8    Period Weeks    Status New    Target Date 02/12/21      PT LONG TERM GOAL #2   Title Pt will report neck and shoudler pain no greater than 2/10 for 1 week in order to indicate improved pain levels    Baseline Patient has moderate pain with sitting at computer for longer than an hour and frequently has to change positions.    Time 8    Period Weeks    Status New    Target Date 03/12/21      PT LONG TERM GOAL #3   Title Patient will report ability to perform all housework involving overhead reaching in order to improve her function with ADLs    Baseline And ability to perform housework involving overhead reaching above approximately 130 degrees of shoulder flexion    Time 8    Period Weeks    Status New                   Plan - 02/03/21 0919     Clinical Impression Statement Patient presents with increased tightness in R upper trap which she noted as unusual given that usually the left side is more symptomatic. She responded well to manual therapy paired with active ROM and reported decreased tension by end of session. Patient demonstrates good understanding of strengthening progressions and will continue to benefit from skilled PT to increase strength, posture, proprioceptive awareness and manage pain to improve quality of life and return to prior level of function.    Personal Factors and Comorbidities Comorbidity 1    Examination-Activity Limitations Reach Overhead;Lift;Sit     Examination-Participation Restrictions Driving;Other    Stability/Clinical Decision Making Stable/Uncomplicated    Rehab Potential Good    PT Frequency 2x / week    PT Duration Other (comment)   10 weeks   PT Treatment/Interventions ADLs/Self Care Home Management;Therapeutic activities;Therapeutic exercise;Neuromuscular re-education;Dry needling;Passive range of motion;Joint Manipulations;Spinal Manipulations;Manual techniques    PT Next Visit Plan Review  HEP. manual therapy to imrove UT, levator and subocippital muscular trigger points. Introduce closed change shoulder stability and scapular/serratus strengthening.    PT Home Exercise Plan Access Code: [email or web address]  URL: [email or web address]    Consulted and Agree with Plan of Care Patient             Patient will benefit from skilled therapeutic intervention in order to improve the following deficits and impairments:  Decreased activity tolerance, Impaired flexibility, Impaired UE functional use, Improper body mechanics, Postural dysfunction, Pain  Visit Diagnosis: Cervicalgia  Muscle weakness (generalized)     Problem List There are no problems to display for this patient.  Arsenio Katz, SPT   This entire session was performed under direct supervision and direction of a licensed therapist/therapist assistant . I have personally read, edited and approve of the note as written.  Janna Arch, PT, DPT  02/03/2021, 12:54 PM  [place] MAIN [facility] SERVICES [address] [place], [place], [address] Phone: [phone]   Fax:  [phone]  Name: Jocabed Cheese MRN: [id number] Date of Birth: [birth year]

## 2021-02-05 ENCOUNTER — Other Ambulatory Visit: Payer: Self-pay

## 2021-02-05 ENCOUNTER — Ambulatory Visit: Payer: Medicare Other | Attending: Orthopaedic Surgery | Admitting: Physical Therapy

## 2021-02-05 DIAGNOSIS — M25511 Pain in right shoulder: Secondary | ICD-10-CM | POA: Diagnosis present

## 2021-02-05 DIAGNOSIS — M25512 Pain in left shoulder: Secondary | ICD-10-CM | POA: Insufficient documentation

## 2021-02-05 DIAGNOSIS — R2689 Other abnormalities of gait and mobility: Secondary | ICD-10-CM | POA: Diagnosis present

## 2021-02-05 DIAGNOSIS — M542 Cervicalgia: Secondary | ICD-10-CM | POA: Insufficient documentation

## 2021-02-05 DIAGNOSIS — G8929 Other chronic pain: Secondary | ICD-10-CM | POA: Diagnosis present

## 2021-02-05 DIAGNOSIS — M6281 Muscle weakness (generalized): Secondary | ICD-10-CM | POA: Insufficient documentation

## 2021-02-05 NOTE — Therapy (Signed)
[place] MAIN [facility] SERVICES [address] [place], [place], [address] Phone: [phone]   Fax:  [phone]  Physical Therapy Treatment  Patient Details  Name: Virginia Floyd MRN: [id number] Date of Birth: 03-07-1956 Referring Provider (PT): Starling Manns, MD   Encounter Date: 02/05/2021   PT End of Session - 02/05/21 1024     Visit Number 7    Number of Visits 17    Date for PT Re-Evaluation 03/26/21    Authorization Type Medicare    Authorization Time Period 01/15/21-03/26/21    Progress Note Due on Visit 10    PT Start Time 0933    PT Stop Time 1015    PT Time Calculation (min) 42 min    Activity Tolerance Patient tolerated treatment well;No increased pain    Behavior During Therapy [facility] for tasks assessed/performed             History reviewed. No pertinent past medical history.  History reviewed. No pertinent surgical history.  There were no vitals filed for this visit.   Subjective Assessment - 02/05/21 0935     Subjective Patient reports doing well since last visit. Only had one headache at 3/10 yesterday and contributes it to "lots of driving". Denies current pain or headache.    Patient is accompained by: Family member;Interpreter    Pertinent History Pt reports pain in her neck and back radiating into her shoulders bilaterally. Pt reports she had MRI in December and she was told she had a torn rotator cuff and was again told later by different MD she did not have rotator cuff tear. Pt reports she normally sleeps with right arm under pillow and this causes pain now, pt also reports she has difficulty getting objects out of shelves that are high due to pain. Pt reports her husband recently passed away following 88 years of marriage. 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    Limitations Other (comment)    How long can you sit comfortably? 1 hour (computer use)     Currently in Pain? No/denies    Pain Score 0-No pain                 Manual x43min:  Right upper trap tightness> left noted, active left rotation with manual depression of shoulder by SPT. Upper trap stretch 2x30sec each side, cervical side bending 2x30sec each side, suboccipital release, manual upper trap release.  TherEx: Cervical retraction, 2x10 Shoulder extension with RTB 2x10 Bilateral ER with RTB 1x12 Supine Angel wings with cervical retraction, 2x10 Seated Y's with 2# 1x10 Supine Scap punches with 2# weight, 2x10. Cuing needed for form.  Modified pushup plus on wall for mobility and closed chain strengthening, patient rated as easy. 1x10 Cat-Cow 1x20 for thoracic mobility.      Pt educated throughout session about proper posture and technique with exercises. Improved exercise technique, movement at target joints, use of target muscles after min to mod verbal, visual, tactile cues.   Patient continues to report reduced headache symptoms and pain between visits. Upper trap tightness noted on right side and responded well to manual therapy, with patient reporting reduced symptoms after treatment. Patient tolerated additional closed chain scapular strengthening and mobility work well and was encouraged to incorporate thoracic mobility into HEP, especially on days with increased driving and activities that trigger headache symptoms. Patient will continue to benefit from skilled PT to  increase strength, reduce pain, and return to prior level of function.                  PT Education - 02/05/21 1023     Education Details Education on thoracic mobility exercises, exercise technique.    Person(s) Educated Patient    Methods Explanation;Demonstration;Tactile cues;Verbal cues    Comprehension Verbalized understanding;Returned demonstration;Verbal cues required;Tactile cues required              PT Short Term Goals - 01/15/21 1645       PT SHORT TERM  GOAL #1   Title Patient will improve shoulder AROM to > 140 degrees of flexion, scaption, and abduction for improved ability to perform overhead activities.    Baseline See flowsheet    Time 4    Period Weeks    Status New    Target Date 02/12/21      PT SHORT TERM GOAL #2   Title Patient will increase FOTO score to equal to or greater than 54    to demonstrate statistically significant improvement in mobility and quality of life.    Baseline 50 at IE    Time 4    Period Weeks    Target Date 02/12/21               PT Long Term Goals - 01/15/21 1648       PT LONG TERM GOAL #1   Title Patient will increase FOTO score to equal to or greater than  58   to demonstrate statistically significant improvement in mobility and quality of life.    Baseline 50    Time 8    Period Weeks    Status New    Target Date 02/12/21      PT LONG TERM GOAL #2   Title Pt will report neck and shoudler pain no greater than 2/10 for 1 week in order to indicate improved pain levels    Baseline Patient has moderate pain with sitting at computer for longer than an hour and frequently has to change positions.    Time 8    Period Weeks    Status New    Target Date 03/12/21      PT LONG TERM GOAL #3   Title Patient will report ability to perform all housework involving overhead reaching in order to improve her function with ADLs    Baseline And ability to perform housework involving overhead reaching above approximately 130 degrees of shoulder flexion    Time 8    Period Weeks    Status New                   Plan - 02/05/21 1025     Clinical Impression Statement Patient continues to report reduced headache symptoms and pain between visits. Upper trap tightness noted on right side and responded well to manual therapy, with patient reporting reduced symptoms after treatment. Patient tolerated additional closed chain scapular strengthening and mobility work well and was encouraged to  incorporate thoracic mobility into HEP, especially on days with increased driving and activities that trigger headache symptoms. Patient will continue to benefit from skilled PT to increase strength, reduce pain, and return to prior level of function.    Personal Factors and Comorbidities Comorbidity 1    Examination-Activity Limitations Reach Overhead;Lift;Sit    Examination-Participation Restrictions Driving;Other    Stability/Clinical Decision Making Stable/Uncomplicated    Rehab Potential Good    PT Frequency 2x /  week    PT Duration Other (comment)   10 weeks   PT Treatment/Interventions ADLs/Self Care Home Management;Therapeutic activities;Therapeutic exercise;Neuromuscular re-education;Dry needling;Passive range of motion;Joint Manipulations;Spinal Manipulations;Manual techniques    PT Next Visit Plan Review HEP. manual therapy to imrove UT, levator and subocippital muscular trigger points. Introduce closed change shoulder stability and scapular/serratus strengthening.    PT Home Exercise Plan Access Code: [email or web address]  URL: [email or web address]    Consulted and Agree with Plan of Care Patient             Patient will benefit from skilled therapeutic intervention in order to improve the following deficits and impairments:  Decreased activity tolerance, Impaired flexibility, Impaired UE functional use, Improper body mechanics, Postural dysfunction, Pain  Visit Diagnosis: Cervicalgia  Muscle weakness (generalized)     Problem List There are no problems to display for this patient.  Arsenio Katz, SPT  Patrina Levering PT, DPT  [place] MAIN [facility] SERVICES [address] [place], [place], [address] Phone: [phone]   Fax:  [phone]  Name: Virginia Floyd MRN: [id number] Date of Birth: Dec 05, 1955

## 2021-02-10 ENCOUNTER — Ambulatory Visit: Payer: Medicare Other | Admitting: Physical Therapy

## 2021-02-10 ENCOUNTER — Encounter: Payer: Self-pay | Admitting: Physical Therapy

## 2021-02-10 ENCOUNTER — Other Ambulatory Visit: Payer: Self-pay

## 2021-02-10 DIAGNOSIS — M542 Cervicalgia: Secondary | ICD-10-CM | POA: Diagnosis not present

## 2021-02-10 DIAGNOSIS — G8929 Other chronic pain: Secondary | ICD-10-CM

## 2021-02-10 DIAGNOSIS — M25512 Pain in left shoulder: Secondary | ICD-10-CM

## 2021-02-10 DIAGNOSIS — M6281 Muscle weakness (generalized): Secondary | ICD-10-CM

## 2021-02-10 NOTE — Therapy (Signed)
[place] MAIN [facility] SERVICES [address] [place], [place], [address] Phone: [phone]   Fax:  [phone]  Physical Therapy Treatment  Patient Details  Name: Virginia Floyd MRN: [id number] Date of Birth: 09-28-1955 Referring Provider (PT): Starling Manns, MD   Encounter Date: 02/10/2021   PT End of Session - 02/10/21 0944     Visit Number 8    Number of Visits 17    Date for PT Re-Evaluation 03/26/21    Authorization Type Medicare    Authorization Time Period 01/15/21-03/26/21    Progress Note Due on Visit 10    PT Start Time 0934    PT Stop Time 1015    PT Time Calculation (min) 41 min    Activity Tolerance Patient limited by pain    Behavior During Therapy [facility] for tasks assessed/performed             History reviewed. No pertinent past medical history.  History reviewed. No pertinent surgical history.  There were no vitals filed for this visit.   Subjective Assessment - 02/10/21 0942     Subjective Patient reports increased right side neck pain to 8/10. She doesn't remember doing anything, it just started on Saturday. She has been using heat, ibuprofen and resting and its just not alleviating.    Patient is accompained by: Family member;Interpreter    Pertinent History Pt reports pain in her neck and back radiating into her shoulders bilaterally. Pt reports she had MRI in December and she was told she had a torn rotator cuff and was again told later by different MD she did not have rotator cuff tear. Pt reports she normally sleeps with right arm under pillow and this causes pain now, pt also reports she has difficulty getting objects out of shelves that are high due to pain. Pt reports her husband recently passed away following 79 years of marriage. 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    Limitations Other (comment)    How long can you sit comfortably? 1 hour (computer  use)    Currently in Pain? Yes    Pain Score 8     Pain Location Neck    Pain Orientation Right;Posterior;Lateral    Pain Descriptors / Indicators Aching;Sore    Pain Type Acute pain    Pain Onset In the past 7 days    Pain Frequency Intermittent    Aggravating Factors  turning head    Pain Relieving Factors heat helps temporarily;    Effect of Pain on Daily Activities decreased activity tolerance;    Multiple Pain Sites No             TREATMENT: Pt reports increased neck/head pain this session  PT applied moist heat to cervical spine in hooklying with head supported x5 min (unbilled);  Cervical AROM: Rotation: R: 40 degrees with pain, L: 50 degrees with pain PT performed extensive manual therapy: Suboccipital release 20 sec hold x5 reps with moderate discomfort reported during release and then less pain after relaxation;  PT assessed passive cervical ROM with passive upper trap stretch with overpressure to shoulder depression 20 sec hold x2 reps each direction PT performed passive lateral translation ROM x5 reps each direction PT performed grade II lateral mobilization C1-C2 right to left 10 sec bouts x5 reps to facilitate better joint mobility and less stiffness PT performed gentle distraction of cervical spine, 15 sec  hold, x2 reps, patient reports increased pain with distraction, discontinued Assessed soft tissue in cervical spine with minimal tightness noted, moderate tenderness reported  Patient does report increased pain down RUE PT performed RUE pectoralis stretch 20 sec hold x2 reps Progressed to RUE scalene stretch with left lateral flexion 20 sec hold PT performed passive RUE median nerve stretch 5 sec hold x3 reps with moderate discomfort reported  Following manual therapy, re-measured cervical ROM: AROM: Rotation: R: 50 degrees, L: 60 degrees Patient reports continued high levels of pain but was able to exhibit better ROM in cervical spine  PT educated patient  in proper sleeping position. Suggested patient place a towel roll in pillow to help support cervical spine. Patient demonstrated current sleeping position. PT recommended patient avoid sleeping with UE flexed overhead and to try and prop UE on pillow for better shoulder positioning. Patient is a side sleeper and reports increased discomfort when on right side.  Also recommended patient use a cervical roll for support when resting. She verbalized understanding.  Instructed patient in standing RUE scalene stretch to help reduce tightness in cervical spine as well as pain down RUE; Patient able to verbalize and demonstrate understanding  She tolerated session fair. She is still having increased pain in cervical spine at end of session. Would benefit from additional instruction to help reduce discomfort;                             PT Education - 02/10/21 0944     Education Details positioning, ROM; HEP reinforced;    Person(s) Educated Patient    Methods Explanation;Verbal cues    Comprehension Verbalized understanding;Returned demonstration;Verbal cues required;Need further instruction              PT Short Term Goals - 01/15/21 1645       PT SHORT TERM GOAL #1   Title Patient will improve shoulder AROM to > 140 degrees of flexion, scaption, and abduction for improved ability to perform overhead activities.    Baseline See flowsheet    Time 4    Period Weeks    Status New    Target Date 02/12/21      PT SHORT TERM GOAL #2   Title Patient will increase FOTO score to equal to or greater than 54    to demonstrate statistically significant improvement in mobility and quality of life.    Baseline 50 at IE    Time 4    Period Weeks    Target Date 02/12/21               PT Long Term Goals - 01/15/21 1648       PT LONG TERM GOAL #1   Title Patient will increase FOTO score to equal to or greater than  58   to demonstrate statistically significant  improvement in mobility and quality of life.    Baseline 50    Time 8    Period Weeks    Status New    Target Date 02/12/21      PT LONG TERM GOAL #2   Title Pt will report neck and shoudler pain no greater than 2/10 for 1 week in order to indicate improved pain levels    Baseline Patient has moderate pain with sitting at computer for longer than an hour and frequently has to change positions.    Time 8    Period Weeks  Status New    Target Date 03/12/21      PT LONG TERM GOAL #3   Title Patient will report ability to perform all housework involving overhead reaching in order to improve her function with ADLs    Baseline And ability to perform housework involving overhead reaching above approximately 130 degrees of shoulder flexion    Time 8    Period Weeks    Status New                   Plan - 02/10/21 1051     Clinical Impression Statement Patient reports increased right sided neck pain this session. She reports its been going on since Saturday with no mechanism of injury. She has been trying heat and gentle stretches but is in a lot of pain. PT peformed gentle ROM/stretches and joint mobilization on cervical spine. Patient reports moderate discomfort with most mobility. However at end of session she does exhibit improvement in cervical ROM by 10 degrees each direction. Patient educated in proper sleeping position to help reduce discomfort. She would benefit from additional skilled PT Intervention to improve ROM and reduce pain with ADLs.    Personal Factors and Comorbidities Comorbidity 1    Examination-Activity Limitations Reach Overhead;Lift;Sit    Examination-Participation Restrictions Driving;Other    Stability/Clinical Decision Making Stable/Uncomplicated    Rehab Potential Good    PT Frequency 2x / week    PT Duration Other (comment)   10 weeks   PT Treatment/Interventions ADLs/Self Care Home Management;Therapeutic activities;Therapeutic exercise;Neuromuscular  re-education;Dry needling;Passive range of motion;Joint Manipulations;Spinal Manipulations;Manual techniques    PT Next Visit Plan Review HEP. manual therapy to imrove UT, levator and subocippital muscular trigger points. Introduce closed change shoulder stability and scapular/serratus strengthening.    PT Home Exercise Plan Access Code: [email or web address]  URL: [email or web address]    Consulted and Agree with Plan of Care Patient             Patient will benefit from skilled therapeutic intervention in order to improve the following deficits and impairments:  Decreased activity tolerance, Impaired flexibility, Impaired UE functional use, Improper body mechanics, Postural dysfunction, Pain  Visit Diagnosis: Cervicalgia  Muscle weakness (generalized)  Chronic left shoulder pain  Chronic right shoulder pain     Problem List There are no problems to display for this patient.   Landis Dowdy, PT, DPT 02/10/2021, 2:25 PM  [place] MAIN [facility] SERVICES [address] [place], [place], [address] Phone: [phone]   Fax:  [phone]  Name: Zannie Locastro MRN: [id number] Date of Birth: Apr 23, 1955

## 2021-02-12 ENCOUNTER — Other Ambulatory Visit: Payer: Self-pay

## 2021-02-12 ENCOUNTER — Encounter: Payer: Self-pay | Admitting: Physical Therapy

## 2021-02-12 ENCOUNTER — Ambulatory Visit: Payer: Medicare Other | Admitting: Physical Therapy

## 2021-02-12 DIAGNOSIS — M25512 Pain in left shoulder: Secondary | ICD-10-CM

## 2021-02-12 DIAGNOSIS — M542 Cervicalgia: Secondary | ICD-10-CM | POA: Diagnosis not present

## 2021-02-12 DIAGNOSIS — G8929 Other chronic pain: Secondary | ICD-10-CM

## 2021-02-12 DIAGNOSIS — M25511 Pain in right shoulder: Secondary | ICD-10-CM

## 2021-02-12 DIAGNOSIS — M6281 Muscle weakness (generalized): Secondary | ICD-10-CM

## 2021-02-12 NOTE — Therapy (Signed)
[place] [facility] SERVICES [address] [place], [place], [address] Phone: [phone]   Fax:  [phone]  Physical Therapy Treatment  Patient Details  Name: Virginia Floyd MRN: [id number] Date of Birth: April 29, 1955 Referring Provider (PT): Starling Manns, MD   Encounter Date: 02/12/2021   PT End of Session - 02/12/21 1028     Visit Number 9    Number of Visits 17    Date for PT Re-Evaluation 03/26/21    Authorization Type Medicare    Authorization Time Period 01/15/21-03/26/21    Progress Note Due on Visit 10    PT Start Time 1018    PT Stop Time 1100    PT Time Calculation (min) 42 min    Activity Tolerance Patient limited by pain    Behavior During Therapy [facility] for tasks assessed/performed             History reviewed. No pertinent past medical history.  History reviewed. No pertinent surgical history.  There were no vitals filed for this visit.   Subjective Assessment - 02/12/21 1027     Subjective Patient reports continued increased neck/headache at 8/10. she reports no relief with pain meds. She tried to see a Restaurant manager, fast food but no one would accept her insurance;    Patient is accompained by: Family member;Interpreter    Pertinent History Pt reports pain in her neck and back radiating into her shoulders bilaterally. Pt reports she had MRI in December and she was told she had a torn rotator cuff and was again told later by different MD she did not have rotator cuff tear. Pt reports she normally sleeps with right arm under pillow and this causes pain now, pt also reports she has difficulty getting objects out of shelves that are high due to pain. Pt reports her husband recently passed away following 16 years of marriage. 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    Limitations Other (comment)    How long can you sit comfortably? 1 hour (computer use)    Currently in Pain?  Yes    Pain Score 8     Pain Location Neck    Pain Orientation Right    Pain Descriptors / Indicators Aching;Sore    Pain Onset In the past 7 days    Pain Frequency Intermittent    Aggravating Factors  turning head    Pain Relieving Factors heat helps temporarily; pain patch    Effect of Pain on Daily Activities decreased activity tolerance;    Multiple Pain Sites No                 TREATMENT:    TREATMENT: Pt reports increased neck/head pain this session   PT applied moist heat to cervical spine in hooklying with head supported x5 min (unbilled);   Cervical AROM: Rotation: R: 20 degrees with pain, L: 40 degrees with pain PT performed extensive manual therapy: Suboccipital release 20 sec hold x5 reps with moderate discomfort reported during release and then less pain after relaxation;  PT assessed passive cervical ROM with passive upper trap stretch with overpressure to shoulder depression 20 sec hold x2 reps each direction PT performed passive lateral translation ROM x5 reps each direction  Assessed soft tissue in cervical spine with trigger points noted in right suboccipital muscles and paraspinals, moderate tenderness reported  Trigger Point Dry Needling (TDN), unbilled Education performed with patient regarding  potential benefit of TDN. Reviewed precautions and risks with patient. Reviewed special precautions/risks over lung fields which include pneumothorax. Reviewed signs and symptoms of pneumothorax and advised pt to go to ER immediately if these symptoms develop advise them of dry needling treatment. Extensive time spent with pt to ensure full understanding of TDN risks. Pt provided verbal consent to treatment. TDN performed to with 0.25 x 40 single needle placements with local twitch response (LTR). Pistoning technique utilized. Improved pain-free motion following intervention. Musculature focused on are: bilateral occipital musculature, L and R cervical paraspinals, R  upper trap, R thoracic paraspinal musculature at T9 region.   Dry needling performed by Janna Arch PT, DPT (unbilled)  PT idenitified tightness in thoracic spine Performed grade II-III inferior rib mobs at T8-T9, 10 sec bouts x3 sets    She tolerated session fair. She is still having increased pain in cervical spine at end of session, however was able to exhibit better ease in turning head  Added TENs to treatment plan for better pain control;                            PT Short Term Goals - 01/15/21 1645       PT SHORT TERM GOAL #1   Title Patient will improve shoulder AROM to > 140 degrees of flexion, scaption, and abduction for improved ability to perform overhead activities.    Baseline See flowsheet    Time 4    Period Weeks    Status New    Target Date 02/12/21      PT SHORT TERM GOAL #2   Title Patient will increase FOTO score to equal to or greater than 54    to demonstrate statistically significant improvement in mobility and quality of life.    Baseline 50 at IE    Time 4    Period Weeks    Target Date 02/12/21               PT Long Term Goals - 01/15/21 1648       PT LONG TERM GOAL #1   Title Patient will increase FOTO score to equal to or greater than  58   to demonstrate statistically significant improvement in mobility and quality of life.    Baseline 50    Time 8    Period Weeks    Status New    Target Date 02/12/21      PT LONG TERM GOAL #2   Title Pt will report neck and shoudler pain no greater than 2/10 for 1 week in order to indicate improved pain levels    Baseline Patient has moderate pain with sitting at computer for longer than an hour and frequently has to change positions.    Time 8    Period Weeks    Status New    Target Date 03/12/21      PT LONG TERM GOAL #3   Title Patient will report ability to perform all housework involving overhead reaching in order to improve her function with ADLs    Baseline And  ability to perform housework involving overhead reaching above approximately 130 degrees of shoulder flexion    Time 8    Period Weeks    Status New                   Plan - 02/12/21 1028     Clinical Impression Statement Patient is still  having high levels of neck/headache; PT applied moist heat while discussing plan of care. Patient reports her MD suggested TENs unit and/or dry needling. Patient does not have any precautions/contraindications to TENs, will add that to plan of care. She also does not have any precautions to dry needling. PT idenitified trigger points along right suboccipital muscles. Janna Arch, PT was able to do dry needling with patient reporting some relief. PT identified increased tightness along right ribs/thoracic spine which could also be increasing pain. She tolerated manual therapy well. At end of session patient able to move head easier with less discomfort. She would benefit from additional skilled PT Intervention to improve ROM    Personal Factors and Comorbidities Comorbidity 1    Examination-Activity Limitations Reach Overhead;Lift;Sit    Examination-Participation Restrictions Driving;Other    Stability/Clinical Decision Making Stable/Uncomplicated    Rehab Potential Good    PT Frequency 2x / week    PT Duration Other (comment)   10 weeks   PT Treatment/Interventions ADLs/Self Care Home Management;Therapeutic activities;Therapeutic exercise;Neuromuscular re-education;Dry needling;Passive range of motion;Joint Manipulations;Spinal Manipulations;Manual techniques;Electrical Stimulation;Moist Heat;Cryotherapy;Traction    PT Next Visit Plan Review HEP. manual therapy to imrove UT, levator and subocippital muscular trigger points. Introduce closed change shoulder stability and scapular/serratus strengthening.    PT Home Exercise Plan Access Code: [email or web address]  URL: [email or web address]    Consulted and Agree with Plan of Care Patient              Patient will benefit from skilled therapeutic intervention in order to improve the following deficits and impairments:  Decreased activity tolerance, Impaired flexibility, Impaired UE functional use, Improper body mechanics, Postural dysfunction, Pain  Visit Diagnosis: Cervicalgia  Muscle weakness (generalized)  Chronic left shoulder pain  Chronic right shoulder pain     Problem List There are no problems to display for this patient.   Licet Dunphy, PT, DPT 02/12/2021, 10:31 AM  [place] [facility] SERVICES [address] [place], [place], [address] Phone: [phone]   Fax:  [phone]  Name: Virginia Floyd MRN: [id number] Date of Birth: February 25, 1956

## 2021-02-18 ENCOUNTER — Other Ambulatory Visit: Payer: Self-pay

## 2021-02-18 ENCOUNTER — Ambulatory Visit: Payer: Medicare Other

## 2021-02-18 DIAGNOSIS — G8929 Other chronic pain: Secondary | ICD-10-CM

## 2021-02-18 DIAGNOSIS — R2689 Other abnormalities of gait and mobility: Secondary | ICD-10-CM

## 2021-02-18 DIAGNOSIS — M542 Cervicalgia: Secondary | ICD-10-CM | POA: Diagnosis not present

## 2021-02-18 DIAGNOSIS — M25511 Pain in right shoulder: Secondary | ICD-10-CM

## 2021-02-18 NOTE — Therapy (Signed)
[place] MAIN [facility] SERVICES [address] [place], [place], [address] Phone: [phone]   Fax:  [phone]  Physical Therapy Treatment/Physical Therapy Progress Note   Dates of reporting period  01/15/2021   to   02/18/2021   Patient Details  Name: Virginia Floyd MRN: [id number] Date of Birth: 1955/06/07 Referring Provider (PT): Starling Manns, MD   Encounter Date: 02/18/2021   PT End of Session - 02/18/21 1711     Visit Number 10    Number of Visits 17    Date for PT Re-Evaluation 03/26/21    Authorization Type Medicare    Authorization Time Period 01/15/21-03/26/21    Progress Note Due on Visit 10    PT Start Time 1005    PT Stop Time 1045    PT Time Calculation (min) 40 min    Activity Tolerance Patient limited by pain    Behavior During Therapy [facility] for tasks assessed/performed             History reviewed. No pertinent past medical history.  History reviewed. No pertinent surgical history.  There were no vitals filed for this visit.   Subjective Assessment - 02/18/21 1001     Subjective Pt reports current neck and shoulder pain as 6/10. Pt reports this is causing her headaches that can reach 7-8/10 that last her most of the day. Pt reports she has surgery on her hand tomorrow morning. She is currently taking 400 mg of ibuprofen 3x/day. Pt reports difficulty sleeping because she is having trouble getting comfortable..  Pt reports because of surgery she will likely not be able to come on Thursday.    Patient is accompained by: Family member;Interpreter    Pertinent History Pt reports pain in her neck and back radiating into her shoulders bilaterally. Pt reports she had MRI in December and she was told she had a torn rotator cuff and was again told later by different MD she did not have rotator cuff tear. Pt reports she normally sleeps with right arm under pillow and this causes pain now, pt also reports she has difficulty  getting objects out of shelves that are high due to pain. Pt reports her husband recently passed away following 63 years of marriage. 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    Limitations Other (comment)    How long can you sit comfortably? 1 hour (computer use)    Currently in Pain? Yes    Pain Score 6     Pain Onset In the past 7 days             INTERVENTIONS - reassessed goals for progress note, see goal section for details  AROM of cervical spine: no pain, but greatest limitation with R cervical rotation, other movements appear WFL.  Manual: Pt seated, STM and TrP release applied to R shoulder musculature, R cervical paraspinals and occipital triangle x several minutes with TrP noted throughout. Pt also performs upper trap stretch for 2x30 sec and cervical extensors stretch for 60 sec while PT provides STM.  Seated chin tucks 10x with 3 sec holds  Pt supine on plinth, STM and TrP release continues to be applied to R shoulder musculature, R cervical paraspinals and occipital triangle. PT also provides gentle traction and brings pt through cervical rotation stretch B, and cervical flexion stretch.   Pt also performed supine chin tucks 10x with 3 sec  holds with manual resistance from PT.  Seated shoulder scap squeezes 10x; pt reports this is pain-relieving movement for her.  Pt does report HA at end of session. PT instructs pt to perform scapular squeezes and other pain-reducing activities following appointment. As pt wearing medicated patch on shoulder PT instructs pt not to use heat while wearing patch to avoid burns. Pt verbalizes understanding.    Pt educated throughout session about proper posture and technique with exercises. Improved exercise technique, movement at target joints, use of target muscles after min to mod verbal, visual, tactile cues.      PT Education - 02/18/21 1711     Education Details  exercise technique, body mechanics, assessment findings    Person(s) Educated Patient    Methods Explanation;Demonstration;Verbal cues    Comprehension Returned demonstration;Verbalized understanding;Need further instruction              PT Short Term Goals - 02/18/21 1712       PT SHORT TERM GOAL #1   Title Patient will improve shoulder AROM to > 140 degrees of flexion, scaption, and abduction for improved ability to perform overhead activities.    Baseline See flowsheet; 11/15: flexion 141, abduction 135, scaption 140 and not painful    Time 4    Period Weeks    Status Partially Met    Target Date 03/18/21      PT SHORT TERM GOAL #2   Title Patient will increase FOTO score to equal to or greater than 54    to demonstrate statistically significant improvement in mobility and quality of life.    Baseline 50 at IE; 11/15: 49    Time 4    Period Weeks    Status On-going    Target Date 03/18/21               PT Long Term Goals - 02/18/21 1714       PT LONG TERM GOAL #1   Title Patient will increase FOTO score to equal to or greater than  58   to demonstrate statistically significant improvement in mobility and quality of life.    Baseline 50; 11/15: 49    Time 8    Period Weeks    Status On-going    Target Date 04/15/21      PT LONG TERM GOAL #2   Title Pt will report neck and shoudler pain no greater than 2/10 for 1 week in order to indicate improved pain levels    Baseline Patient has moderate pain with sitting at computer for longer than an hour and frequently has to change positions.; 11/15 pt reports 7/10 as average pain for past week    Time 8    Period Weeks    Status On-going    Target Date 03/12/21      PT LONG TERM GOAL #3   Title Patient will report ability to perform all housework involving overhead reaching in order to improve her function with ADLs    Baseline And ability to perform housework involving overhead reaching above approximately 130  degrees of shoulder flexion; 11/15: reports improvement, however, still difficulty with many activities due to pain    Time 8    Period Weeks    Status On-going    Target Date 03/12/21                   Plan - 02/18/21 1725     Clinical Impression Statement Goals reassessed for progress note.  Pt making gains on UE ROM goals. Pt still reports 7/10 as average R side neck/HA pain throughout week. The pt does report perceived functional progress, but describes limitations with ADLs due to pain. Pt FOTO score remained similar to previous assessment (49 vs 50), indicating continued impairment of functional mobility.Patient's condition has the potential to improve in response to therapy. Maximum improvement is yet to be obtained. The anticipated improvement is attainable and reasonable in a generally predictable time. The pt will benefit from further skilled PT to continue to improve pain, ROM and general mobility to increase ease with ADLs and QOL.    Personal Factors and Comorbidities Comorbidity 1    Examination-Activity Limitations Reach Overhead;Lift;Sit    Examination-Participation Restrictions Driving;Other    Stability/Clinical Decision Making Stable/Uncomplicated    Rehab Potential Good    PT Frequency 2x / week    PT Duration Other (comment)   10 weeks   PT Treatment/Interventions ADLs/Self Care Home Management;Therapeutic activities;Therapeutic exercise;Neuromuscular re-education;Dry needling;Passive range of motion;Joint Manipulations;Spinal Manipulations;Manual techniques;Electrical Stimulation;Moist Heat;Cryotherapy;Traction    PT Next Visit Plan Review HEP. manual therapy to imrove UT, levator and subocippital muscular trigger points. Introduce closed change shoulder stability and scapular/serratus strengthening.    PT Home Exercise Plan Access Code: [email or web address]  URL: [email or web address]    Consulted and Agree with Plan of Care Patient             Patient  will benefit from skilled therapeutic intervention in order to improve the following deficits and impairments:  Decreased activity tolerance, Impaired flexibility, Impaired UE functional use, Improper body mechanics, Postural dysfunction, Pain  Visit Diagnosis: Chronic right shoulder pain  Cervicalgia  Other abnormalities of gait and mobility     Problem List There are no problems to display for this patient.   Zollie Pee, PT 02/18/2021, 5:27 PM  [place] MAIN [facility] SERVICES [address] [place], [place], [address] Phone: [phone]   Fax:  [phone]  Name: Virginia Floyd MRN: [id number] Date of Birth: 1956/03/16

## 2021-02-20 ENCOUNTER — Other Ambulatory Visit: Payer: Self-pay

## 2021-02-20 ENCOUNTER — Encounter: Payer: Self-pay | Admitting: Physical Therapy

## 2021-02-20 ENCOUNTER — Other Ambulatory Visit: Payer: Self-pay | Admitting: Rehabilitation

## 2021-02-20 ENCOUNTER — Ambulatory Visit: Payer: Medicare Other | Admitting: Physical Therapy

## 2021-02-20 DIAGNOSIS — M542 Cervicalgia: Secondary | ICD-10-CM | POA: Diagnosis not present

## 2021-02-20 DIAGNOSIS — M47812 Spondylosis without myelopathy or radiculopathy, cervical region: Secondary | ICD-10-CM

## 2021-02-20 DIAGNOSIS — M25511 Pain in right shoulder: Secondary | ICD-10-CM

## 2021-02-20 NOTE — Therapy (Signed)
[place] MAIN [facility] SERVICES [address] [place], [place], [address] Phone: [phone]   Fax:  [phone]  Physical Therapy Treatment  Patient Details  Name: Virginia Floyd MRN: [id number] Date of Birth: 1955-12-12 Referring Provider (PT): Starling Manns, MD   Encounter Date: 02/20/2021   PT End of Session - 02/20/21 1044     Visit Number 11    Number of Visits 17    Date for PT Re-Evaluation 03/26/21    Authorization Type Medicare    Authorization Time Period 01/15/21-03/26/21    Progress Note Due on Visit 10    PT Start Time 1015    PT Stop Time 1050    PT Time Calculation (min) 35 min    Activity Tolerance Patient limited by pain;Patient tolerated treatment well    Behavior During Therapy [facility] for tasks assessed/performed             History reviewed. No pertinent past medical history.  History reviewed. No pertinent surgical history.  There were no vitals filed for this visit.   Subjective Assessment - 02/20/21 1017     Subjective Pt reports current neck and shoulder pain as 6/10. Pt reports this is causing her headaches that can reach 7-8/10 that last her most of the day. Pt reports she has surgery on her hand tomorrow morning. She is currently taking 400 mg of ibuprofen 3x/day. Pt reports difficulty sleeping because she is having trouble getting comfortable..  Pt reports because of surgery she will likely not be able to come on Thursday.    Patient is accompained by: Family member;Interpreter    Pertinent History Pt reports pain in her neck and back radiating into her shoulders bilaterally. Pt reports she had MRI in December and she was told she had a torn rotator cuff and was again told later by different MD she did not have rotator cuff tear. Pt reports she normally sleeps with right arm under pillow and this causes pain now, pt also reports she has difficulty getting objects out of shelves that are high due to pain. Pt  reports her husband recently passed away following 32 years of marriage. 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    Limitations Other (comment)    How long can you sit comfortably? 1 hour (computer use)    Pain Score 6     Pain Location Neck    Pain Orientation Right    Pain Descriptors / Indicators Aching;Sore    Pain Onset In the past 7 days               Treatment provided this session  Therex:   Chin tucks 1 x 10 x 5 sec in supine - to improve strength and activatio nof postural musculature   Chin tuck with cervical rotation to approximately 30 degrees x 10 to each side   Scap retraction 10 x 5 sec to engage postural muscles as well as to mobilize lower cervical spine     Manual Therapy:  Suboccipital release 3 x 1 min ea Cuboccipital muscle, SCM, and bilateral uppert trapezius muscle STM and trigger point release x 15 min  R upper trap release 2 x 1 min each  Pt reports improved pain levels following manual therapy and no s/s of headache during therapy        Pt educated throughout session about proper posture and technique with exercises.  Improved exercise technique, movement at target joints, use of target muscles after min to mod verbal, visual, tactile cues.                          PT Education - 02/20/21 1331     Education Details Exercise technique    Person(s) Educated Patient    Methods Explanation;Demonstration;Verbal cues    Comprehension Verbalized understanding;Returned demonstration;Need further instruction              PT Short Term Goals - 02/18/21 1712       PT SHORT TERM GOAL #1   Title Patient will improve shoulder AROM to > 140 degrees of flexion, scaption, and abduction for improved ability to perform overhead activities.    Baseline See flowsheet; 11/15: flexion 141, abduction 135, scaption 140 and not painful    Time 4    Period Weeks     Status Partially Met    Target Date 03/18/21      PT SHORT TERM GOAL #2   Title Patient will increase FOTO score to equal to or greater than 54    to demonstrate statistically significant improvement in mobility and quality of life.    Baseline 50 at IE; 11/15: 49    Time 4    Period Weeks    Status On-going    Target Date 03/18/21               PT Long Term Goals - 02/18/21 1714       PT LONG TERM GOAL #1   Title Patient will increase FOTO score to equal to or greater than  58   to demonstrate statistically significant improvement in mobility and quality of life.    Baseline 50; 11/15: 49    Time 8    Period Weeks    Status On-going    Target Date 04/15/21      PT LONG TERM GOAL #2   Title Pt will report neck and shoudler pain no greater than 2/10 for 1 week in order to indicate improved pain levels    Baseline Patient has moderate pain with sitting at computer for longer than an hour and frequently has to change positions.; 11/15 pt reports 7/10 as average pain for past week    Time 8    Period Weeks    Status On-going    Target Date 03/12/21      PT LONG TERM GOAL #3   Title Patient will report ability to perform all housework involving overhead reaching in order to improve her function with ADLs    Baseline And ability to perform housework involving overhead reaching above approximately 130 degrees of shoulder flexion; 11/15: reports improvement, however, still difficulty with many activities due to pain    Time 8    Period Weeks    Status On-going    Target Date 03/12/21                   Plan - 02/20/21 1058     Clinical Impression Statement Pt continues to demonstrate excellent motivation for completion og therapy program. Stephanie Acre was somewhat limited today due to recent surgical procedure on hand which limited some of her normal upper back and postural exercises. Pt continues to have intermittent pin and headaches but reports her shoulder function  has improved significantly since eval and her headaches appear to be less frequent in occurrence. Pt will continue to benefit form  skilled PT intervention in order to improve her posture, pain and improve her QOL.    Personal Factors and Comorbidities Comorbidity 1    Examination-Activity Limitations Reach Overhead;Lift;Sit    Examination-Participation Restrictions Driving;Other    Stability/Clinical Decision Making Stable/Uncomplicated    Rehab Potential Good    PT Frequency 2x / week    PT Duration Other (comment)   10 weeks   PT Treatment/Interventions ADLs/Self Care Home Management;Therapeutic activities;Therapeutic exercise;Neuromuscular re-education;Dry needling;Passive range of motion;Joint Manipulations;Spinal Manipulations;Manual techniques;Electrical Stimulation;Moist Heat;Cryotherapy;Traction    PT Next Visit Plan Review HEP. manual therapy to imrove UT, levator and subocippital muscular trigger points. Consider shoudler and upper back strengthening with consideration of recent hand surgery.    PT Home Exercise Plan Access Code: [email or web address]  URL: [email or web address]    Consulted and Agree with Plan of Care Patient             Patient will benefit from skilled therapeutic intervention in order to improve the following deficits and impairments:  Decreased activity tolerance, Impaired flexibility, Impaired UE functional use, Improper body mechanics, Postural dysfunction, Pain  Visit Diagnosis: Cervicalgia  Chronic right shoulder pain     Problem List There are no problems to display for this patient.   Particia Lather, PT 02/20/2021, 1:38 PM  [place] MAIN [facility] SERVICES [address] [place], [place], [address] Phone: [phone]   Fax:  [phone]  Name: Virginia Floyd MRN: [id number] Date of Birth: 1955-10-14

## 2021-02-24 ENCOUNTER — Ambulatory Visit: Payer: Medicare Other

## 2021-02-24 ENCOUNTER — Other Ambulatory Visit: Payer: Self-pay

## 2021-02-24 DIAGNOSIS — G8929 Other chronic pain: Secondary | ICD-10-CM

## 2021-02-24 DIAGNOSIS — M6281 Muscle weakness (generalized): Secondary | ICD-10-CM

## 2021-02-24 DIAGNOSIS — M542 Cervicalgia: Secondary | ICD-10-CM | POA: Diagnosis not present

## 2021-02-24 NOTE — Therapy (Signed)
[place] MAIN [facility] SERVICES [address] [place], [place], [address] Phone: [phone]   Fax:  [phone]  Physical Therapy Treatment  Patient Details  Name: Virginia Floyd MRN: [id number] Date of Birth: [birth year] Referring Provider (PT): Starling Manns, MD   Encounter Date: 02/24/2021   PT End of Session - 02/24/21 1108     Visit Number 12    Number of Visits 17    Date for PT Re-Evaluation 03/26/21    Authorization Type Medicare    Authorization Time Period 01/15/21-03/26/21    Progress Note Due on Visit 10    PT Start Time 1018    PT Stop Time 1058    PT Time Calculation (min) 40 min    Activity Tolerance Patient tolerated treatment well;No increased pain    Behavior During Therapy [facility] for tasks assessed/performed             History reviewed. No pertinent past medical history.  History reviewed. No pertinent surgical history.  There were no vitals filed for this visit.   Subjective Assessment - 02/24/21 1018     Subjective Pt reports she thinks neck and shoulder pain has improved. She rates it as a 7/10 currently.    Patient is accompained by: Family member;Interpreter    Pertinent History Pt reports pain in her neck and back radiating into her shoulders bilaterally. Pt reports she had MRI in December and she was told she had a torn rotator cuff and was again told later by different MD she did not have rotator cuff tear. Pt reports she normally sleeps with right arm under pillow and this causes pain now, pt also reports she has difficulty getting objects out of shelves that are high due to pain. Pt reports her husband recently passed away following 20 years of marriage. 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    Limitations Other (comment)    How long can you sit comfortably? 1 hour (computer use)    Currently in Pain? Yes    Pain Location Neck    Pain Orientation  Right    Pain Onset In the past 7 days            INTERVENTIONS  Manual Therapy:  PT supine on plinth while PT provides the following STM and TrP release to B upper trap, posterior shoulder musculature, cervical paraspinals, SCM, R anterior delt., and occipital musculature with a focus on R side  x several minutes, PT also provides suboccipital release x 5 min --Pt also brought through the following stretches while manual therapy performed: Upper trap stretch B  Cervical Rotation stretch B Stretch into cervical flexion  Comments: Pt TTP in R anterior delt/supraspinatus and R rhomboid.   Therex:    Chin tucks 1 x 10 x 5 sec in supine - performed to improve strength and activation of postural musculature; pt rates as easy   Chin tuck with cervical rotation to approximately 30 degrees x 10 with 3-5 sec holds on each side   Scapular squeezes 10x with 5 sec holds followed by TrP release to R rhomboid 2x30 sec with pressure to tolerance  Seated red P.Ball rollouts forward/backward with pt holding a chin tuck for 3 sec at end range. Pt instructed to push ball forward with L hand and not R hand, as R hand recently operated on. Pt verbalized understanding and demonstrated correct technique, reporting no  pain with intervention.   Pt on plinth, prone deep neck flexor endurance with chin tuck position 10x with 5 sec holds     Comments after interventions at end of session: Pt states  "I'm not really having any pain"     Pt educated throughout session about proper posture and technique with exercises. Improved exercise technique, movement at target joints, use of target muscles after min to mod verbal, visual, tactile cues.      PT Education - 02/24/21 1108     Education Details exercise technique, body mechanics    Person(s) Educated Patient    Methods Explanation;Demonstration;Tactile cues;Verbal cues    Comprehension Verbalized understanding;Returned demonstration;Need further  instruction              PT Short Term Goals - 02/18/21 1712       PT SHORT TERM GOAL #1   Title Patient will improve shoulder AROM to > 140 degrees of flexion, scaption, and abduction for improved ability to perform overhead activities.    Baseline See flowsheet; 11/15: flexion 141, abduction 135, scaption 140 and not painful    Time 4    Period Weeks    Status Partially Met    Target Date 03/18/21      PT SHORT TERM GOAL #2   Title Patient will increase FOTO score to equal to or greater than 54    to demonstrate statistically significant improvement in mobility and quality of life.    Baseline 50 at IE; 11/15: 49    Time 4    Period Weeks    Status On-going    Target Date 03/18/21               PT Long Term Goals - 02/18/21 1714       PT LONG TERM GOAL #1   Title Patient will increase FOTO score to equal to or greater than  58   to demonstrate statistically significant improvement in mobility and quality of life.    Baseline 50; 11/15: 49    Time 8    Period Weeks    Status On-going    Target Date 04/15/21      PT LONG TERM GOAL #2   Title Pt will report neck and shoudler pain no greater than 2/10 for 1 week in order to indicate improved pain levels    Baseline Patient has moderate pain with sitting at computer for longer than an hour and frequently has to change positions.; 11/15 pt reports 7/10 as average pain for past week    Time 8    Period Weeks    Status On-going    Target Date 03/12/21      PT LONG TERM GOAL #3   Title Patient will report ability to perform all housework involving overhead reaching in order to improve her function with ADLs    Baseline And ability to perform housework involving overhead reaching above approximately 130 degrees of shoulder flexion; 11/15: reports improvement, however, still difficulty with many activities due to pain    Time 8    Period Weeks    Status On-going    Target Date 03/12/21                    Plan - 02/24/21 1109     Clinical Impression Statement Pt continues to show progress reporting overall improvement with neck/shoulder pain since last PT session. She tolerates all interventions well without reports of increased pain and at end  of session states she is "not really having any pain." Interventions continue to be performed within pain-free ranges and somewhat limited due to recent R hand surgery. The pt will benefit from further skilled PT to improve pain, posture, and general mobility in order to increase QOL and return pt to PLOF.    Personal Factors and Comorbidities Comorbidity 1    Examination-Activity Limitations Reach Overhead;Lift;Sit    Examination-Participation Restrictions Driving;Other    Stability/Clinical Decision Making Stable/Uncomplicated    Rehab Potential Good    PT Frequency 2x / week    PT Duration Other (comment)   10 weeks   PT Treatment/Interventions ADLs/Self Care Home Management;Therapeutic activities;Therapeutic exercise;Neuromuscular re-education;Dry needling;Passive range of motion;Joint Manipulations;Spinal Manipulations;Manual techniques;Electrical Stimulation;Moist Heat;Cryotherapy;Traction    PT Next Visit Plan Review HEP. manual therapy to imrove UT, levator and subocippital muscular trigger points. Consider shoudler and upper back strengthening with consideration of recent hand surgery. continue POC as previously indicated    PT Home Exercise Plan Access Code: [email or web address]  URL: [email or web address]    Consulted and Agree with Plan of Care Patient             Patient will benefit from skilled therapeutic intervention in order to improve the following deficits and impairments:  Decreased activity tolerance, Impaired flexibility, Impaired UE functional use, Improper body mechanics, Postural dysfunction, Pain  Visit Diagnosis: Chronic right shoulder pain  Muscle weakness (generalized)  Cervicalgia     Problem List There are no  problems to display for this patient.   Zollie Pee, PT 02/24/2021, 11:21 AM  [place] MAIN [facility] SERVICES [address] [place], [place], [address] Phone: [phone]   Fax:  [phone]  Name: Virginia Floyd MRN: [id number] Date of Birth: [birth year]

## 2021-02-26 ENCOUNTER — Other Ambulatory Visit: Payer: Self-pay

## 2021-02-26 ENCOUNTER — Ambulatory Visit: Payer: Medicare Other

## 2021-02-26 DIAGNOSIS — M542 Cervicalgia: Secondary | ICD-10-CM | POA: Diagnosis not present

## 2021-02-26 DIAGNOSIS — G8929 Other chronic pain: Secondary | ICD-10-CM

## 2021-02-26 NOTE — Therapy (Signed)
[place] MAIN [facility] SERVICES [address] [place], [place], [address] Phone: [phone]   Fax:  [phone]  Physical Therapy Treatment  Patient Details  Name: Virginia Floyd MRN: [id number] Date of Birth: 12/17/1955 Referring Provider (PT): Starling Manns, MD   Encounter Date: 02/26/2021   PT End of Session - 02/26/21 1832     Visit Number 13    Number of Visits 17    Date for PT Re-Evaluation 03/26/21    Authorization Type Medicare    Authorization Time Period 01/15/21-03/26/21    Progress Note Due on Visit 10    PT Start Time 1150    PT Stop Time 1231    PT Time Calculation (min) 41 min    Activity Tolerance Patient tolerated treatment well;No increased pain    Behavior During Therapy [facility] for tasks assessed/performed             History reviewed. No pertinent past medical history.  History reviewed. No pertinent surgical history.  There were no vitals filed for this visit.   Subjective Assessment - 02/26/21 1152     Subjective Pt reports current pain level is a 5/10. She states she "felt great" immediately after last appointment but then achey later and had to use heat to her shoulder.    Patient is accompained by: Family member;Interpreter    Pertinent History Pt reports pain in her neck and back radiating into her shoulders bilaterally. Pt reports she had MRI in December and she was told she had a torn rotator cuff and was again told later by different MD she did not have rotator cuff tear. Pt reports she normally sleeps with right arm under pillow and this causes pain now, pt also reports she has difficulty getting objects out of shelves that are high due to pain. Pt reports her husband recently passed away following 48 years of marriage. 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    Limitations Other (comment)    How long can you sit comfortably? 1 hour (computer use)     Currently in Pain? Yes    Pain Score 5     Pain Descriptors / Indicators Aching    Pain Onset In the past 7 days            INTERVENTIONS    Hot pack applied to R shoulder while pt seated in chair, PT providing the following treatment: -STM and TrP release to B upper trap, posterior shoulder musculature, cervical paraspinals, SCM, R anterior delt. with a focus on R side  x several minutes. Pt with several TrPs along R rhomboid, middle trap along R cervical paraspinals. Most prevalent in R rhomboid.  - Grade 1-II CPAs from C7-T5, where pt is tender throughout  Comments: The above interventions were performed for several minutes, where CPAs were performed in 20-30 second bouts.   Hot pack removed. Skin appears WNL and pt reports no adverse reaction to treatment, reports heat feels good and relaxing to R shoulder musculature and helps pain.  PT provides pain science education x 4 min.  Seated thoracic extension over chair 10x with 2 sec hold  Seated scapular squeezes 10x with 2-3 second holds  Upper trap stretch 30 sec B  Cervical rotation stretch 30 sec B  Cervical flexion and extension stretches 30 sec each   Shoulder shrugs 10x  Supine chin tucks into manual resistance from PT  10x with 5 second holds  Hot pack placed on R shoulder at end of session x 4 min while PT provides further manual therapy with pt supine on plinth, including suboccipital release with gentle traction.  At end of session pt reports neck pain has decreased to 3/10.    Pt educated throughout session about proper posture and technique with exercises. Improved exercise technique, movement at target joints, use of target muscles after min to mod verbal, visual, tactile cues.       PT Education - 02/26/21 1832     Education Details Pain science education, exercise technique    Person(s) Educated Patient    Methods Explanation;Demonstration;Verbal cues    Comprehension Verbalized  understanding;Returned demonstration;Need further instruction              PT Short Term Goals - 02/18/21 1712       PT SHORT TERM GOAL #1   Title Patient will improve shoulder AROM to > 140 degrees of flexion, scaption, and abduction for improved ability to perform overhead activities.    Baseline See flowsheet; 11/15: flexion 141, abduction 135, scaption 140 and not painful    Time 4    Period Weeks    Status Partially Met    Target Date 03/18/21      PT SHORT TERM GOAL #2   Title Patient will increase FOTO score to equal to or greater than 54    to demonstrate statistically significant improvement in mobility and quality of life.    Baseline 50 at IE; 11/15: 49    Time 4    Period Weeks    Status On-going    Target Date 03/18/21               PT Long Term Goals - 02/18/21 1714       PT LONG TERM GOAL #1   Title Patient will increase FOTO score to equal to or greater than  58   to demonstrate statistically significant improvement in mobility and quality of life.    Baseline 50; 11/15: 49    Time 8    Period Weeks    Status On-going    Target Date 04/15/21      PT LONG TERM GOAL #2   Title Pt will report neck and shoudler pain no greater than 2/10 for 1 week in order to indicate improved pain levels    Baseline Patient has moderate pain with sitting at computer for longer than an hour and frequently has to change positions.; 11/15 pt reports 7/10 as average pain for past week    Time 8    Period Weeks    Status On-going    Target Date 03/12/21      PT LONG TERM GOAL #3   Title Patient will report ability to perform all housework involving overhead reaching in order to improve her function with ADLs    Baseline And ability to perform housework involving overhead reaching above approximately 130 degrees of shoulder flexion; 11/15: reports improvement, however, still difficulty with many activities due to pain    Time 8    Period Weeks    Status On-going     Target Date 03/12/21                   Plan - 02/26/21 1843     Clinical Impression Statement Pt responds well to treatment this session reporting pain decrease from 5/10 to 3/10 following interventions. Pt continues to be TTP  throughout R posterior shoulder musculature and cervical parapsinals. Pt also found to be TTP along C7-T5. PT provided pain science education on this date. The pt will benefit from further skliled PT to improve pain, posture, and mobility in order to increase QOL and return to PLOF.    Personal Factors and Comorbidities Comorbidity 1    Examination-Activity Limitations Reach Overhead;Lift;Sit    Examination-Participation Restrictions Driving;Other    Stability/Clinical Decision Making Stable/Uncomplicated    Rehab Potential Good    PT Frequency 2x / week    PT Duration Other (comment)   10 weeks   PT Treatment/Interventions ADLs/Self Care Home Management;Therapeutic activities;Therapeutic exercise;Neuromuscular re-education;Dry needling;Passive range of motion;Joint Manipulations;Spinal Manipulations;Manual techniques;Electrical Stimulation;Moist Heat;Cryotherapy;Traction    PT Next Visit Plan Review HEP. manual therapy to imrove UT, levator and subocippital muscular trigger points. Consider shoudler and upper back strengthening with consideration of recent hand surgery. continue POC as previously indicated    PT Home Exercise Plan Access Code: [email or web address]  URL: [email or web address]    Consulted and Agree with Plan of Care Patient             Patient will benefit from skilled therapeutic intervention in order to improve the following deficits and impairments:  Decreased activity tolerance, Impaired flexibility, Impaired UE functional use, Improper body mechanics, Postural dysfunction, Pain  Visit Diagnosis: Chronic right shoulder pain  Cervicalgia     Problem List There are no problems to display for this patient.   Zollie Pee,  PT 02/26/2021, 6:46 PM  [place] MAIN [facility] SERVICES [address] [place], [place], [address] Phone: [phone]   Fax:  [phone]  Name: Virginia Floyd MRN: [id number] Date of Birth: 12-09-1955

## 2021-03-04 ENCOUNTER — Other Ambulatory Visit: Payer: Self-pay

## 2021-03-04 ENCOUNTER — Ambulatory Visit: Payer: Medicare Other

## 2021-03-04 DIAGNOSIS — M542 Cervicalgia: Secondary | ICD-10-CM | POA: Diagnosis not present

## 2021-03-04 DIAGNOSIS — M6281 Muscle weakness (generalized): Secondary | ICD-10-CM

## 2021-03-04 NOTE — Therapy (Signed)
[place] MAIN [facility] SERVICES [address] [place], [place], [address] Phone: [phone]   Fax:  [phone]  Physical Therapy Treatment  Patient Details  Name: Tyffany Waldrop MRN: [id number] Date of Birth: 1956-02-17 Referring Provider (PT): Starling Manns, MD   Encounter Date: 03/04/2021   PT End of Session - 03/04/21 1536     Visit Number 14    Number of Visits 17    Date for PT Re-Evaluation 03/26/21    Authorization Type Medicare    Authorization Time Period 01/15/21-03/26/21    Progress Note Due on Visit 10    PT Start Time 1003    PT Stop Time 1045    PT Time Calculation (min) 42 min    Activity Tolerance Patient tolerated treatment well;No increased pain    Behavior During Therapy [facility] for tasks assessed/performed             History reviewed. No pertinent past medical history.  History reviewed. No pertinent surgical history.  There were no vitals filed for this visit.   Subjective Assessment - 03/04/21 1001     Subjective Pt reports pain level is currently 3/10. She reports highest pain level in last 48 hours has been a 3/10. She says most triggering activity is driving.    Patient is accompained by: Family member;Interpreter    Pertinent History Pt reports pain in her neck and back radiating into her shoulders bilaterally. Pt reports she had MRI in December and she was told she had a torn rotator cuff and was again told later by different MD she did not have rotator cuff tear. Pt reports she normally sleeps with right arm under pillow and this causes pain now, pt also reports she has difficulty getting objects out of shelves that are high due to pain. Pt reports her husband recently passed away following 40 years of marriage. 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    Limitations Other (comment)    How long can you sit comfortably? 1 hour (computer use)     Currently in Pain? Yes    Pain Score 3     Pain Location Neck    Pain Orientation Right    Pain Onset In the past 7 days            INTERVENTIONS -     Patient supine on plinth: 14 min -STM and TrP release to R upper trap, R posterior shoulder musculature, R cervical paraspinals, SCM. Pt with 2 TrPs along R rhomboid.  While PT provides STM, PT also brings patient through upper trap stretch bilaterally, cervical rotation stretch bilaterally, and cervical extensors stretch for bouts of 30-45 seconds. PT also provides suboccipital release x 60 sec.  Chin tucks 10x with 3 sec holds  Ankle weight donned on wrists (to maintain restrictions/precautions due to recent  R hand procedure): Side-lying shoulder ER - 1.5# 1x10, 2# 1x10, 1x20;  Side-lying shoulder abduction 2# 10x, rates easy  Seated abduction 10x 2#, 15x 2#; pt rates moderate  Seated shoulder flexion  2# 2x15; pt rates as moderate Seated shoulder arcs - 2# CW/CC 10x for each  Bent over rows with RUE with 2# 2x10  At end of session pt reports no pain.    Pt educated throughout session about proper posture and technique with exercises. Improved exercise technique, movement at target joints, use of target muscles after min to mod  verbal, visual, tactile cues.        PT Education - 03/04/21 1536     Education Details Exercise technique, body mechanics    Person(s) Educated Patient    Methods Explanation;Demonstration;Verbal cues;Tactile cues    Comprehension Verbalized understanding;Returned demonstration;Need further instruction              PT Short Term Goals - 02/18/21 1712       PT SHORT TERM GOAL #1   Title Patient will improve shoulder AROM to > 140 degrees of flexion, scaption, and abduction for improved ability to perform overhead activities.    Baseline See flowsheet; 11/15: flexion 141, abduction 135, scaption 140 and not painful    Time 4    Period Weeks    Status Partially Met    Target Date  03/18/21      PT SHORT TERM GOAL #2   Title Patient will increase FOTO score to equal to or greater than 54    to demonstrate statistically significant improvement in mobility and quality of life.    Baseline 50 at IE; 11/15: 49    Time 4    Period Weeks    Status On-going    Target Date 03/18/21               PT Long Term Goals - 02/18/21 1714       PT LONG TERM GOAL #1   Title Patient will increase FOTO score to equal to or greater than  58   to demonstrate statistically significant improvement in mobility and quality of life.    Baseline 50; 11/15: 49    Time 8    Period Weeks    Status On-going    Target Date 04/15/21      PT LONG TERM GOAL #2   Title Pt will report neck and shoudler pain no greater than 2/10 for 1 week in order to indicate improved pain levels    Baseline Patient has moderate pain with sitting at computer for longer than an hour and frequently has to change positions.; 11/15 pt reports 7/10 as average pain for past week    Time 8    Period Weeks    Status On-going    Target Date 03/12/21      PT LONG TERM GOAL #3   Title Patient will report ability to perform all housework involving overhead reaching in order to improve her function with ADLs    Baseline And ability to perform housework involving overhead reaching above approximately 130 degrees of shoulder flexion; 11/15: reports improvement, however, still difficulty with many activities due to pain    Time 8    Period Weeks    Status On-going    Target Date 03/12/21                   Plan - 03/04/21 1540     Clinical Impression Statement Patient motivated throughout session and responds well to interventions, beginning session with 3/10 pain and ending session with no pain.  Patient was able to progress to right shoulder strengthening interventions with ankle weight donned to right forearm due to recent procedure to R hand.  The patient will benefit from further skilled PT to improve  pain, mobility, and posture in order to increase quality of life and return to prior level of function.    Personal Factors and Comorbidities Comorbidity 1    Examination-Activity Limitations Reach Overhead;Lift;Sit    Examination-Participation Restrictions Driving;Other  Stability/Clinical Decision Making Stable/Uncomplicated    Rehab Potential Good    PT Frequency 2x / week    PT Duration Other (comment)   10 weeks   PT Treatment/Interventions ADLs/Self Care Home Management;Therapeutic activities;Therapeutic exercise;Neuromuscular re-education;Dry needling;Passive range of motion;Joint Manipulations;Spinal Manipulations;Manual techniques;Electrical Stimulation;Moist Heat;Cryotherapy;Traction    PT Next Visit Plan Review HEP. manual therapy to imrove UT, levator and subocippital muscular trigger points. Consider shoudler and upper back strengthening with consideration of recent hand surgery. continue POC as previously indicated    PT Home Exercise Plan Access Code: [email or web address]  URL: [email or web address]; no updates    Consulted and Agree with Plan of Care Patient             Patient will benefit from skilled therapeutic intervention in order to improve the following deficits and impairments:  Decreased activity tolerance, Impaired flexibility, Impaired UE functional use, Improper body mechanics, Postural dysfunction, Pain  Visit Diagnosis: Cervicalgia  Muscle weakness (generalized)     Problem List There are no problems to display for this patient.   Zollie Pee, PT 03/04/2021, 3:44 PM  [place] MAIN [facility] SERVICES [address] [place], [place], [address] Phone: [phone]   Fax:  [phone]  Name: Micki Cassel MRN: [id number] Date of Birth: Jan 11, 1956

## 2021-03-06 ENCOUNTER — Encounter: Payer: Self-pay | Admitting: Emergency Medicine

## 2021-03-06 ENCOUNTER — Other Ambulatory Visit: Payer: Self-pay

## 2021-03-06 ENCOUNTER — Emergency Department: Payer: Medicare Other

## 2021-03-06 ENCOUNTER — Ambulatory Visit: Payer: Medicare Other | Attending: Orthopaedic Surgery

## 2021-03-06 ENCOUNTER — Emergency Department
Admission: EM | Admit: 2021-03-06 | Discharge: 2021-03-06 | Disposition: A | Discharge status: Home / Self Care | Payer: Medicare Other | Attending: Emergency Medicine | Admitting: Emergency Medicine

## 2021-03-06 DIAGNOSIS — R079 Chest pain, unspecified: Secondary | ICD-10-CM | POA: Diagnosis present

## 2021-03-06 DIAGNOSIS — R0789 Other chest pain: Secondary | ICD-10-CM | POA: Insufficient documentation

## 2021-03-06 DIAGNOSIS — E119 Type 2 diabetes mellitus without complications: Secondary | ICD-10-CM | POA: Diagnosis not present

## 2021-03-06 DIAGNOSIS — M542 Cervicalgia: Secondary | ICD-10-CM | POA: Insufficient documentation

## 2021-03-06 DIAGNOSIS — R0602 Shortness of breath: Secondary | ICD-10-CM | POA: Insufficient documentation

## 2021-03-06 HISTORY — DX: Type 2 diabetes mellitus without complications: E11.9

## 2021-03-06 HISTORY — DX: Non-Hodgkin lymphoma, unspecified, unspecified site: C85.90

## 2021-03-06 LAB — CBC
HCT: 42.2 % (ref 36.0–46.0)
Hemoglobin: 13.4 g/dL (ref 12.0–15.0)
MCH: 27.3 pg (ref 26.0–34.0)
MCHC: 31.8 g/dL (ref 30.0–36.0)
MCV: 86.1 fL (ref 80.0–100.0)
Platelets: 204 10*3/uL (ref 150–400)
RBC: 4.9 MIL/uL (ref 3.87–5.11)
RDW: 14.3 % (ref 11.5–15.5)
WBC: 7.5 10*3/uL (ref 4.0–10.5)
nRBC: 0 % (ref 0.0–0.2)

## 2021-03-06 LAB — BASIC METABOLIC PANEL
Anion gap: 6 (ref 5–15)
BUN: 11 mg/dL (ref 8–23)
CO2: 26 mmol/L (ref 22–32)
Calcium: 9.1 mg/dL (ref 8.9–10.3)
Chloride: 106 mmol/L (ref 98–111)
Creatinine, Ser: 0.68 mg/dL (ref 0.44–1.00)
GFR, Estimated: >60 mL/min (ref >60)
Glucose, Bld: 189 mg/dL — ABNORMAL HIGH (ref 70–99)
Potassium: 4.1 mmol/L (ref 3.5–5.1)
Sodium: 138 mmol/L (ref 135–145)

## 2021-03-06 LAB — TROPONIN I (HIGH SENSITIVITY)
Troponin I (High Sensitivity): <2 ng/L (ref <18)
Troponin I (High Sensitivity): <2 ng/L (ref <18)

## 2021-03-06 MED ORDER — ALBUTEROL SULFATE (2.5 MG/3ML) 0.083% IN NEBU: 2.5000 mg | INHALATION_SOLUTION | RESPIRATORY_TRACT | 0 refills | Status: DC | PRN

## 2021-03-06 NOTE — Therapy (Signed)
[place] MAIN [facility] SERVICES [address] [place], [place], [address] Phone: [phone]   Fax:  [phone]  Physical Therapy Note  Patient Details  Name: Virginia Floyd MRN: [id number] Date of Birth: 1956-01-22 Referring Provider (PT): Starling Manns, MD   Encounter Date: 03/06/2021   PT End of Session - 03/06/21 1050     Visit Number 14    Number of Visits 17    Date for PT Re-Evaluation 03/26/21    Authorization Type Medicare    Authorization Time Period 01/15/21-03/26/21    Progress Note Due on Visit 10    PT Start Time 1020    PT Stop Time 1032    PT Time Calculation (min) 12 min    Activity Tolerance Patient tolerated treatment well;No increased pain    Behavior During Therapy [facility] for tasks assessed/performed             History reviewed. No pertinent past medical history.  History reviewed. No pertinent surgical history.  There were no vitals filed for this visit.   Subjective Assessment - 03/06/21 1021     Subjective Pt reports yesterday afternoon she started experiencing "breathing issues" and felt a "pressure in my chest," that she also describes as "like an elephant sitting on my chest." Pt reports this lasted for several hours. She called her doctor's office and was advised to seek emergency care/go to the ED. The pt reports she did not go to the ED because she did not have anyone to look after her dogs and went to sleep. She reports this morning she woke up feeling OK. She reports she now has a telehealth visit scheduled for tomorrow for follow-up.  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    Patient is accompained by: Family member;Interpreter    Pertinent History Pt reports pain in her neck and back radiating into her  shoulders bilaterally. Pt reports she had MRI in December and she was told she had a torn rotator cuff and was again told later by different MD she did not have rotator cuff tear. Pt reports she normally sleeps with right arm under pillow and this causes pain now, pt also reports she has difficulty getting objects out of shelves that are high due to pain. Pt reports her husband recently passed away following 37 years of marriage. 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    Limitations Other (comment)    How long can you sit comfortably? 1 hour (computer use)    Currently in Pain? No/denies    Pain Onset In the past 7 days              No charge for visit. PT escorts pt to ED following pt reports of SOB and chest tightness/pain that lasted for several hours yesterday evening. See subjective for details.    PT Short Term Goals - 02/18/21 1712       PT SHORT TERM GOAL #1   Title Patient will improve shoulder AROM to > 140 degrees of flexion, scaption, and abduction for improved ability to perform overhead activities.    Baseline See flowsheet; 11/15: flexion 141, abduction 135, scaption 140 and not painful  Time 4    Period Weeks    Status Partially Met    Target Date 03/18/21      PT SHORT TERM GOAL #2   Title Patient will increase FOTO score to equal to or greater than 54    to demonstrate statistically significant improvement in mobility and quality of life.    Baseline 50 at IE; 11/15: 49    Time 4    Period Weeks    Status On-going    Target Date 03/18/21               PT Long Term Goals - 02/18/21 1714       PT LONG TERM GOAL #1   Title Patient will increase FOTO score to equal to or greater than  58   to demonstrate statistically significant improvement in mobility and quality of life.    Baseline 50; 11/15: 49    Time 8    Period Weeks    Status On-going    Target Date 04/15/21      PT LONG TERM  GOAL #2   Title Pt will report neck and shoudler pain no greater than 2/10 for 1 week in order to indicate improved pain levels    Baseline Patient has moderate pain with sitting at computer for longer than an hour and frequently has to change positions.; 11/15 pt reports 7/10 as average pain for past week    Time 8    Period Weeks    Status On-going    Target Date 03/12/21      PT LONG TERM GOAL #3   Title Patient will report ability to perform all housework involving overhead reaching in order to improve her function with ADLs    Baseline And ability to perform housework involving overhead reaching above approximately 130 degrees of shoulder flexion; 11/15: reports improvement, however, still difficulty with many activities due to pain    Time 8    Period Weeks    Status On-going    Target Date 03/12/21                   Plan - 03/06/21 1050     Clinical Impression Statement Pt escorted to ED by PT following report of SOB and chest tightness/pain that lasted for several hours yesterday evening. No charge for visit.    Personal Factors and Comorbidities Comorbidity 1    Examination-Activity Limitations Reach Overhead;Lift;Sit    Examination-Participation Restrictions Driving;Other    Stability/Clinical Decision Making Stable/Uncomplicated    Rehab Potential Good    PT Frequency 2x / week    PT Duration Other (comment)   10 weeks   PT Treatment/Interventions ADLs/Self Care Home Management;Therapeutic activities;Therapeutic exercise;Neuromuscular re-education;Dry needling;Passive range of motion;Joint Manipulations;Spinal Manipulations;Manual techniques;Electrical Stimulation;Moist Heat;Cryotherapy;Traction    PT Next Visit Plan Review HEP. manual therapy to imrove UT, levator and subocippital muscular trigger points. Consider shoudler and upper back strengthening with consideration of recent hand surgery. continue POC as previously indicated    PT Home Exercise Plan Access  Code: [email or web address]  URL: [email or web address]; no updates    Consulted and Agree with Plan of Care Patient             Patient will benefit from skilled therapeutic intervention in order to improve the following deficits and impairments:  Decreased activity tolerance, Impaired flexibility, Impaired UE functional use, Improper body mechanics, Postural dysfunction, Pain  Visit Diagnosis: Cervicalgia     Problem List There are no problems to  display for this patient.   Zollie Pee, PT 03/06/2021, 10:52 AM  [place] MAIN [facility] SERVICES [address] [place], [place], [address] Phone: [phone]   Fax:  [phone]  Name: Asianae Minkler MRN: [id number] Date of Birth: 1955-10-28

## 2021-03-06 NOTE — ED Triage Notes (Signed)
Pt comes into the ED via POV c/o labored breathing which then turned into chest heaviness.  PT went to PT this morning, and they brought her down here to be evaluated.  Pt denies any current chest pain or SHOB.  Pt in NAD with even and unlabored respirations.  Pt denies any cardiac history.

## 2021-03-06 NOTE — Discharge Instructions (Signed)
I'd recommend the following:  Next time you experience this feeling of shortness of breath, try an albuterol nebulizer treatment  If this resolves your symptoms, continue this as needed  I'd recommend calling your primary care doctor as well to discuss your ER visit and to discuss setting up an outpatient stress test in the near future

## 2021-03-06 NOTE — ED Provider Notes (Signed)
[facility] Emergency Department Provider Note  ____________________________________________   Event Date/Time   First MD Initiated Contact with Patient 03/06/21 1322     (approximate)  I have reviewed the triage vital signs and the nursing notes.   HISTORY  Chief Complaint Chest Pain and Shortness of Breath    HPI Virginia Floyd is a 65 y.o. female with past medical history of lymphoma here with chest pain.  The patient states that yesterday, she developed fairly gradual onset of shortness of breath.  She felt like she had a fullness in her chest.  She states that this lasted throughout most of the evening last night.  She did not necessarily associated with anything in particular.  She had no associated nausea or vomiting.  No diaphoresis.  No history of cardiac disease.  It resolved and she went to physical therapy this morning.  When she told her physical therapist about this, she was told to come into the ED for evaluation.  She does have a history of somewhat similar episodes and has had negative caths in the past.  Last one was several years ago.  No current chest pain.  No current shortness of breath.  No other complaints.  No cough.  No fevers or chills.    Past Medical History:  Diagnosis Date   Diabetes mellitus without complication ([place])    Lymphoma ([place])     There are no problems to display for this patient.   History reviewed. No pertinent surgical history.  Prior to Admission medications   Medication Sig Start Date End Date Taking? Authorizing Provider  albuterol (PROVENTIL) (2.5 MG/3ML) 0.083% nebulizer solution Take 3 mLs (2.5 mg total) by nebulization every 4 (four) hours as needed for wheezing or shortness of breath. 03/06/21 03/06/22 Yes Duffy Bruce, MD    Allergies Codeine and Sulfa antibiotics  Family History  Problem Relation Age of Onset   Breast cancer Neg Hx     Social History Social History   Tobacco Use    Smoking status: Never   Smokeless tobacco: Never  Substance Use Topics   Alcohol use: Not Currently   Drug use: Not Currently    Review of Systems  Review of Systems  Constitutional:  Negative for fatigue and fever.  HENT:  Negative for congestion and sore throat.   Eyes:  Negative for visual disturbance.  Respiratory:  Positive for chest tightness and shortness of breath. Negative for cough.   Cardiovascular:  Negative for chest pain.  Gastrointestinal:  Negative for abdominal pain, diarrhea, nausea and vomiting.  Genitourinary:  Negative for flank pain.  Musculoskeletal:  Negative for back pain and neck pain.  Skin:  Negative for rash and wound.  Neurological:  Negative for weakness.  All other systems reviewed and are negative.   ____________________________________________  PHYSICAL EXAM:      VITAL SIGNS: ED Triage Vitals  Enc Vitals Group     BP 03/06/21 1327 126/70     Pulse Rate 03/06/21 1327 67     Resp 03/06/21 1327 16     Temp 03/06/21 1327 97.6 F (36.4 C)     Temp Source 03/06/21 1327 Oral     SpO2 03/06/21 1327 97 %     Weight 03/06/21 1054 155 lb (70.3 kg)     Height 03/06/21 1054 5\' 2"  (1.575 m)     Head Circumference --      Peak Flow --      Pain Score 03/06/21 1054  0     Pain Loc --      Pain Edu? --      Excl. in [place]? --      Physical Exam Vitals and nursing note reviewed.  Constitutional:      General: She is not in acute distress.    Appearance: She is well-developed.  HENT:     Head: Normocephalic and atraumatic.  Eyes:     Conjunctiva/sclera: Conjunctivae normal.  Cardiovascular:     Rate and Rhythm: Normal rate and regular rhythm.     Heart sounds: Normal heart sounds. No murmur heard.   No friction rub.  Pulmonary:     Effort: Pulmonary effort is normal. No respiratory distress.     Breath sounds: No decreased breath sounds, wheezing or rales.  Abdominal:     General: There is no distension.     Palpations: Abdomen is soft.      Tenderness: There is no abdominal tenderness.  Musculoskeletal:     Cervical back: Neck supple.  Skin:    General: Skin is warm.     Capillary Refill: Capillary refill takes less than 2 seconds.  Neurological:     Mental Status: She is alert and oriented to person, place, and time.     Motor: No abnormal muscle tone.      ____________________________________________   LABS (all labs ordered are listed, but only abnormal results are displayed)  Labs Reviewed  BASIC METABOLIC PANEL - Abnormal; Notable for the following components:      Result Value   Glucose, Bld 189 (*)    All other components within normal limits  CBC  TROPONIN I (HIGH SENSITIVITY)  TROPONIN I (HIGH SENSITIVITY)    ____________________________________________  EKG: Normal sinus rhythm, ventricular rate 74.  PR 150, QRS 60, QTc 399.  No acute ST elevations or depressions.   ________________________________________  RADIOLOGY All imaging, including plain films, CT scans, and ultrasounds, independently reviewed by me, and interpretations confirmed via formal radiology reads.  ED MD interpretation:   Chest x-ray: Clear  Official radiology report(s): DG Chest 2 View  Result Date: 03/06/2021 CLINICAL DATA:  Chest pain. EXAM: CHEST - 2 VIEW COMPARISON:  None. FINDINGS: The heart size and mediastinal contours are within normal limits. Both lungs are clear. The visualized skeletal structures are unremarkable. IMPRESSION: No active cardiopulmonary disease. Electronically Signed   By: Marijo Conception M.D.   On: 03/06/2021 11:20    ____________________________________________  PROCEDURES   Procedure(s) performed (including Critical Care):  Procedures  ____________________________________________  INITIAL IMPRESSION / MDM / [place] / ED COURSE  As part of my medical decision making, I reviewed the following data within the [place] notes reviewed and incorporated, Old  chart reviewed, Notes from prior ED visits, and [place] Controlled Substance Database       *Bradleigh Sonnen was evaluated in Emergency Department on 03/06/2021 for the symptoms described in the history of present illness. She was evaluated in the context of the global COVID-19 pandemic, which necessitated consideration that the patient might be at risk for infection with the SARS-CoV-2 virus that causes COVID-19. Institutional protocols and algorithms that pertain to the evaluation of patients at risk for COVID-19 are in a state of rapid change based on information released by regulatory bodies including the CDC and federal and state organizations. These policies and algorithms were followed during the patient's care in the ED.  Some ED evaluations and interventions may be delayed as  a result of limited staffing during the pandemic.*     Medical Decision Making: 65 year old female here with transient, atypical shortness of breath.  This is now all resolved.  Clinically, symptoms are consistent with possible mild reactive airway disease and she has had to use albuterol in the past.  Less likely ACS, though I do think she would benefit from outpatient cardiology follow-up.  Here, she is asymptomatic with negative troponins x2 and nonischemic EKG, do not suspect ACS.  Pressure is controlled.  CBC unremarkable.  BMP unremarkable.  Renal function is normal.  Chest x-ray reviewed and is clear.  She satting well on room air.  Will have the patient try albuterol during her next episode, as well as follow-up with her PCP for further discussion of possible outpatient stress testing as she does have a family history.  Otherwise, return precautions given.  No apparent emergent pathology.  ____________________________________________  FINAL CLINICAL IMPRESSION(S) / ED DIAGNOSES  Final diagnoses:  Atypical chest pain     MEDICATIONS GIVEN DURING THIS VISIT:  Medications - No data to display   ED Discharge Orders           Ordered    albuterol (PROVENTIL) (2.5 MG/3ML) 0.083% nebulizer solution  Every 4 hours PRN        03/06/21 1548             Note:  This document was prepared using Dragon voice recognition software and may include unintentional dictation errors.   Duffy Bruce, MD 03/06/21 1736

## 2021-03-06 NOTE — ED Notes (Signed)
Pt to ED to be checked for CP that occurred yesterday. Pt felt SOB then felt chest pressure about 6pm yesterday.  Pt states pressure and SOB resolved on own. Pt in NAD, ambulatory to room.   Pt had similar epidode in 2016 and was evaluated at [facility] and had heart cath at that time. Was not diagnosed with any cardiac problems at that time.

## 2021-03-13 ENCOUNTER — Other Ambulatory Visit: Payer: Self-pay

## 2021-03-13 ENCOUNTER — Ambulatory Visit
Admission: RE | Admit: 2021-03-13 | Discharge: 2021-03-13 | Disposition: A | Discharge status: Home / Self Care | Payer: Medicare Other | Source: Ambulatory Visit | Attending: Rehabilitation | Admitting: Rehabilitation

## 2021-03-13 DIAGNOSIS — M47812 Spondylosis without myelopathy or radiculopathy, cervical region: Secondary | ICD-10-CM

## 2021-05-20 ENCOUNTER — Other Ambulatory Visit: Payer: Self-pay | Admitting: Family Medicine

## 2021-05-20 DIAGNOSIS — R1032 Left lower quadrant pain: Secondary | ICD-10-CM

## 2021-05-20 DIAGNOSIS — C8195 Hodgkin lymphoma, unspecified, lymph nodes of inguinal region and lower limb: Secondary | ICD-10-CM

## 2021-07-25 ENCOUNTER — Ambulatory Visit: Payer: Medicare Other | Admitting: Internal Medicine

## 2021-12-09 ENCOUNTER — Other Ambulatory Visit: Payer: Self-pay | Admitting: Family Medicine

## 2021-12-09 DIAGNOSIS — Z1231 Encounter for screening mammogram for malignant neoplasm of breast: Secondary | ICD-10-CM

## 2022-01-04 ENCOUNTER — Ambulatory Visit
Admission: EM | Admit: 2022-01-04 | Discharge: 2022-01-04 | Disposition: A | Discharge status: Home / Self Care | Payer: Medicare Other

## 2022-01-04 DIAGNOSIS — B029 Zoster without complications: Secondary | ICD-10-CM

## 2022-01-04 MED ORDER — VALACYCLOVIR HCL 1 G PO TABS
1000.0000 mg | ORAL_TABLET | Freq: Three times a day (TID) | ORAL | 0 refills | Status: DC
Start: 2022-01-04 — End: 2022-03-02

## 2022-01-04 NOTE — ED Provider Notes (Signed)
[facility]    CSN: [id number] Arrival date & time: 01/04/22  1057      History   Chief Complaint Chief Complaint  Patient presents with   Rash    HPI Quiera Diffee is a 66 y.o. female.    Rash   Patient presents to urgent care with complaint of rash above her left eye.  Rash is erythematous and she reports crusty.  Has been using hydrocortisone cream as well as antifungal cream without relief.  The rash is itchy.  Past Medical History:  Diagnosis Date   Diabetes mellitus without complication ([place])    Lymphoma ([place])     There are no problems to display for this patient.   History reviewed. No pertinent surgical history.  OB History   No obstetric history on file.      Home Medications    Prior to Admission medications   Medication Sig Start Date End Date Taking? Authorizing Provider  levothyroxine (SYNTHROID) 75 MCG tablet  11/21/20  Yes [provider]  metFORMIN (GLUCOPHAGE) 1000 MG tablet  10/25/18  Yes [provider]  sertraline (ZOLOFT) 25 MG tablet  07/24/19  Yes [provider]  albuterol (PROVENTIL) (2.5 MG/3ML) 0.083% nebulizer solution Take 3 mLs (2.5 mg total) by nebulization every 4 (four) hours as needed for wheezing or shortness of breath. 03/06/21 03/06/22  Duffy Bruce, MD  atorvastatin (LIPITOR) 40 MG tablet Take 1 tablet by mouth daily.    [provider]  cefadroxil (DURICEF) 500 MG capsule Take 500 mg by mouth 2 (two) times daily. 11/08/21   [provider]  cyclobenzaprine (FLEXERIL) 10 MG tablet Take by mouth.    [provider]  fenofibrate micronized (LOFIBRA) 134 MG capsule Take by mouth.    [provider]  gabapentin (NEURONTIN) 100 MG capsule Take 200 mg by mouth at bedtime. 12/29/21   [provider]  glipiZIDE (GLUCOTROL XL) 10 MG 24 hr tablet Take 10 mg by mouth daily. 12/31/21   [provider]  lovastatin (MEVACOR) 40 MG tablet Take by mouth.     [provider]  mirtazapine (REMERON) 15 MG tablet Take 15 mg by mouth at bedtime. 11/26/21   [provider]  oxybutynin (DITROPAN-XL) 5 MG 24 hr tablet Take 5 mg by mouth daily. 12/31/21   [provider]  oxyCODONE-acetaminophen (PERCOCET/ROXICET) 5-325 MG tablet Take 1 tablet by mouth 4 (four) times daily as needed. 11/08/21   [provider]  promethazine (PHENERGAN) 25 MG tablet Take by mouth. 11/08/21   [provider]  simvastatin (ZOCOR) 10 MG tablet Take by mouth.    [provider]  triamcinolone ointment (KENALOG) 0.1 % Apply topically. 11/25/21   [provider]    Family History Family History  Problem Relation Age of Onset   Breast cancer Neg Hx     Social History Social History   Tobacco Use   Smoking status: Never   Smokeless tobacco: Never  Substance Use Topics   Alcohol use: Not Currently   Drug use: Not Currently     Allergies   Latex, Codeine, and Sulfa antibiotics   Review of Systems Review of Systems  Skin:  Positive for rash.     Physical Exam Triage Vital Signs ED Triage Vitals  Enc Vitals Group     BP 01/04/22 1159 (!) 124/56     Pulse Rate 01/04/22 1159 63     Resp 01/04/22 1159 20     Temp  01/04/22 1159 98.6 F (37 C)     Temp Source 01/04/22 1159 Oral     SpO2 01/04/22 1159 97 %     Weight --      Height --      Head Circumference --      Peak Flow --      Pain Score 01/04/22 1155 5     Pain Loc --      Pain Edu? --      Excl. in [place]? --    No data found.  Updated Vital Signs BP (!) 124/56 (BP Location: Right Arm)   Pulse 63   Temp 98.6 F (37 C) (Oral)   Resp 20   SpO2 97%   Visual Acuity Right Eye Distance:   Left Eye Distance:   Bilateral Distance:    Right Eye Near:   Left Eye Near:    Bilateral Near:     Physical Exam Vitals reviewed.  Constitutional:      Appearance: Normal appearance.  HENT:     Head:   Skin:    General: Skin is warm and dry.      Findings: Rash present.  Neurological:     General: No focal deficit present.     Mental Status: She is alert and oriented to person, place, and time.  Psychiatric:        Mood and Affect: Mood normal.        Behavior: Behavior normal.      UC Treatments / Results  Labs (all labs ordered are listed, but only abnormal results are displayed) Labs Reviewed - No data to display  EKG   Radiology No results found.  Procedures Procedures (including critical care time)  Medications Ordered in UC Medications - No data to display  Initial Impression / Assessment and Plan / UC Course  I have reviewed the triage vital signs and the nursing notes.  Pertinent labs & imaging results that were available during my care of the patient were reviewed by me and considered in my medical decision making (see chart for details).   Suspect shingles outbreak.  Do not suspect any ocular involvement-denies any vision changes.  Will treat with Valtrex and continue to recommend use of hydrocortisone cream on the rash, avoiding contact with her eye.  Recommended shingles vaccine at her primary care after resolution of rash.   Final Clinical Impressions(s) / UC Diagnoses   Final diagnoses:  None   Discharge Instructions   None    ED Prescriptions   None    PDMP not reviewed this encounter.   Rose Phi, [place] 01/04/22 1220

## 2022-01-04 NOTE — ED Triage Notes (Signed)
Pt. States she had a rash appear above her eyes. The rash appears red and crusty. Pt. Has used hydrocortisone cream for treatment w/ no relief. Pt. States the rash is itchy.

## 2022-01-04 NOTE — Discharge Instructions (Addendum)
Follow up here or with your primary care provider if your symptoms are worsening or not improving with treatment.     

## 2022-01-28 ENCOUNTER — Ambulatory Visit: Payer: Medicare Other

## 2022-02-09 ENCOUNTER — Encounter (HOSPITAL_BASED_OUTPATIENT_CLINIC_OR_DEPARTMENT_OTHER): Payer: Self-pay | Admitting: Geriatric Medicine

## 2022-02-09 DIAGNOSIS — R0602 Shortness of breath: Secondary | ICD-10-CM

## 2022-02-11 ENCOUNTER — Ambulatory Visit (INDEPENDENT_AMBULATORY_CARE_PROVIDER_SITE_OTHER): Payer: Medicare Other

## 2022-02-11 ENCOUNTER — Encounter (HOSPITAL_BASED_OUTPATIENT_CLINIC_OR_DEPARTMENT_OTHER): Payer: Self-pay | Admitting: Geriatric Medicine

## 2022-02-11 ENCOUNTER — Other Ambulatory Visit (HOSPITAL_BASED_OUTPATIENT_CLINIC_OR_DEPARTMENT_OTHER): Payer: Self-pay | Admitting: Geriatric Medicine

## 2022-02-11 DIAGNOSIS — R0602 Shortness of breath: Secondary | ICD-10-CM | POA: Diagnosis not present

## 2022-02-11 LAB — ECHOCARDIOGRAM COMPLETE
Area-P 1/2: 3.42 cm2
S' Lateral: 2.3 cm

## 2022-03-02 ENCOUNTER — Encounter: Payer: Self-pay | Admitting: Internal Medicine

## 2022-03-02 ENCOUNTER — Ambulatory Visit (INDEPENDENT_AMBULATORY_CARE_PROVIDER_SITE_OTHER): Payer: Medicare Other | Admitting: Internal Medicine

## 2022-03-02 VITALS — BP 120/60 | HR 78 | Temp 97.4°F | Ht 62.0 in | Wt 167.0 lb

## 2022-03-02 DIAGNOSIS — M5412 Radiculopathy, cervical region: Secondary | ICD-10-CM

## 2022-03-02 DIAGNOSIS — E039 Hypothyroidism, unspecified: Secondary | ICD-10-CM | POA: Diagnosis not present

## 2022-03-02 DIAGNOSIS — E785 Hyperlipidemia, unspecified: Secondary | ICD-10-CM | POA: Diagnosis not present

## 2022-03-02 DIAGNOSIS — N3281 Overactive bladder: Secondary | ICD-10-CM

## 2022-03-02 DIAGNOSIS — E1159 Type 2 diabetes mellitus with other circulatory complications: Secondary | ICD-10-CM | POA: Insufficient documentation

## 2022-03-02 DIAGNOSIS — F39 Unspecified mood [affective] disorder: Secondary | ICD-10-CM

## 2022-03-02 DIAGNOSIS — E119 Type 2 diabetes mellitus without complications: Secondary | ICD-10-CM | POA: Diagnosis not present

## 2022-03-02 DIAGNOSIS — C8190 Hodgkin lymphoma, unspecified, unspecified site: Secondary | ICD-10-CM

## 2022-03-02 DIAGNOSIS — R0609 Other forms of dyspnea: Secondary | ICD-10-CM

## 2022-03-02 LAB — CBC
HCT: 42 % (ref 36.0–46.0)
Hemoglobin: 14 g/dL (ref 12.0–15.0)
MCHC: 33.3 g/dL (ref 30.0–36.0)
MCV: 86 fl (ref 78.0–100.0)
Platelets: 168 10*3/uL (ref 150.0–400.0)
RBC: 4.88 Mil/uL (ref 3.87–5.11)
RDW: 15 % (ref 11.5–15.5)
WBC: 6.4 10*3/uL (ref 4.0–10.5)

## 2022-03-02 LAB — LIPID PANEL
Cholesterol: 176 mg/dL (ref 0–200)
HDL: 39.5 mg/dL (ref >39.00)
NonHDL: 136.09
Total CHOL/HDL Ratio: 4
Triglycerides: 282 mg/dL — ABNORMAL HIGH (ref 0.0–149.0)
VLDL: 56.4 mg/dL — ABNORMAL HIGH (ref 0.0–40.0)

## 2022-03-02 LAB — COMPREHENSIVE METABOLIC PANEL
ALT: 21 U/L (ref 0–35)
AST: 16 U/L (ref 0–37)
Albumin: 4.3 g/dL (ref 3.5–5.2)
Alkaline Phosphatase: 111 U/L (ref 39–117)
BUN: 16 mg/dL (ref 6–23)
CO2: 29 mEq/L (ref 19–32)
Calcium: 9.3 mg/dL (ref 8.4–10.5)
Chloride: 101 mEq/L (ref 96–112)
Creatinine, Ser: 0.75 mg/dL (ref 0.40–1.20)
GFR: 81.82 mL/min (ref >60.00)
Glucose, Bld: 236 mg/dL — ABNORMAL HIGH (ref 70–99)
Potassium: 4.2 mEq/L (ref 3.5–5.1)
Sodium: 137 mEq/L (ref 135–145)
Total Bilirubin: 0.5 mg/dL (ref 0.2–1.2)
Total Protein: 6.8 g/dL (ref 6.0–8.3)

## 2022-03-02 LAB — T4, FREE: Free T4: 2.51 ng/dL — ABNORMAL HIGH (ref 0.60–1.60)

## 2022-03-02 LAB — HEMOGLOBIN A1C: Hgb A1c MFr Bld: 7.6 % — ABNORMAL HIGH (ref 4.6–6.5)

## 2022-03-02 LAB — TSH: TSH: 7.19 u[IU]/mL — ABNORMAL HIGH (ref 0.35–5.50)

## 2022-03-02 LAB — MICROALBUMIN / CREATININE URINE RATIO
Creatinine,U: 148.4 mg/dL
Microalb Creat Ratio: 0.5 mg/g (ref 0.0–30.0)
Microalb, Ur: <0.7 mg/dL (ref 0.0–1.9)

## 2022-03-02 LAB — LDL CHOLESTEROL, DIRECT: Direct LDL: 106 mg/dL

## 2022-03-02 MED ORDER — DULOXETINE HCL 30 MG PO CPEP: 30.0000 mg | ORAL_CAPSULE | Freq: Every day | ORAL | 3 refills | Status: DC

## 2022-03-02 NOTE — Progress Notes (Signed)
Subjective:    Patient ID: Virginia Floyd, female    DOB: Aug 17, 1955, 66 y.o.   MRN: [id number]  HPI Here to establish care PCP in [place] moved and lives here  Diabetes for many years---~20 No foot or hand burning or numbness Has concerns about metformin and glipizide Not checking at home much---A1c generally under 7% Especially concerned about possibility of memory problems with metformin--and she has noticed some issues  Has underactive thyroid Wonders about the Rx---has gained 15# in past year or so Trying to be careful with her eating--does fast until lunch (but still snacks) Has treadmill--not doing this much  Ongoing cervical radiculopathy Uses the flexeril and gabapentin '200mg'$  for sleep  On oxybutynin for OAB Not clear how well it is helping Using stimulator which is helping more  Ongoing mood issues Lost husband not long ago and lost 3 dogs Sertraline in the past Now on mirtazapine--in the past year or so  Past smoker Also had "sick building syndrome"---exposed to [place] Still susceptible to SOB with colds, etc Has nebulizer but ran out of solution (also inhaler) Most DOE lately without illness  Multiple trigger finger surgeries Ongoing Duputryen's   Current Outpatient Medications on File Prior to Visit  Medication Sig Dispense Refill   atorvastatin (LIPITOR) 40 MG tablet Take 1 tablet by mouth daily.     BIOTIN PO Take by mouth.     cyclobenzaprine (FLEXERIL) 10 MG tablet Take by mouth.     gabapentin (NEURONTIN) 100 MG capsule Take 200 mg by mouth at bedtime.     glipiZIDE (GLUCOTROL XL) 10 MG 24 hr tablet Take 10 mg by mouth daily.     levothyroxine (SYNTHROID) 75 MCG tablet      metFORMIN (GLUCOPHAGE) 1000 MG tablet      mirtazapine (REMERON) 15 MG tablet Take 15 mg by mouth at bedtime.     oxybutynin (DITROPAN-XL) 5 MG 24 hr tablet Take 5 mg by mouth daily.     No current facility-administered medications on file prior to visit.    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   OAB (overactive bladder)     Past Surgical History:  Procedure Laterality Date   ABDOMINAL HYSTERECTOMY     CHOLECYSTECTOMY     TRIGGER FINGER RELEASE     multiple    Family History  Problem Relation Age of Onset   Lupus Mother    Alcohol abuse Father    Cancer Father    Heart disease Sister    Diabetes Sister    Hodgkin's lymphoma Brother    Bladder Cancer Brother    Breast cancer Neg Hx     Social History   Socioeconomic History   Marital status: Widowed    Spouse name: Not on file   Number of children: 1   Years of education: Not on file   Highest education level: Not on file  Occupational History   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    Comment: Retired  Tobacco Use   Smoking status: Never    Passive exposure: Past   Smokeless tobacco: Never  Substance  and Sexual Activity   Alcohol use: Not Currently   Drug use: Not Currently   Sexual activity: Not on file  Other Topics Concern   Not on file  Social History Narrative   Has living will   Son Nadara Eaton should be decision maker   Woould accept resuscitations   Social Determinants of Health   Financial Resource Strain: Not on file  Food Insecurity: Not on file  Transportation Needs: Not on file  Physical Activity: Not on file  Stress: Not on file  Social Connections: Not on file  Intimate Partner Violence: Not on file   Review of Systems Bowels move okay---will be loose at times No skin concerns--just saw derm.  Does have growing skin tag on left eyelid (going to [place] soon)    Objective:   Physical  Exam Constitutional:      Appearance: Normal appearance.  Cardiovascular:     Rate and Rhythm: Normal rate and regular rhythm.     Pulses: Normal pulses.     Heart sounds: No murmur heard.    No gallop.  Pulmonary:     Effort: Pulmonary effort is normal.     Breath sounds: Normal breath sounds. No wheezing or rales.  Abdominal:     Palpations: Abdomen is soft.     Tenderness: There is no abdominal tenderness.  Musculoskeletal:     Cervical back: Neck supple.     Right lower leg: No edema.     Left lower leg: No edema.  Lymphadenopathy:     Cervical: No cervical adenopathy.  Skin:    Findings: No lesion or rash.     Comments: No foot lesions  Neurological:     Mental Status: She is alert.     Comments: Normal sensation in feet            Assessment & Plan:

## 2022-03-02 NOTE — Assessment & Plan Note (Signed)
Will consider change in metformin (to SGLT-2 inhibitor) Continue 1000 mg daily/glipizide 10 daily for now

## 2022-03-02 NOTE — Assessment & Plan Note (Signed)
Will try cutting the cyclobenzaprine to '5mg'$  and titrating the gabapentin up as needed

## 2022-03-02 NOTE — Assessment & Plan Note (Signed)
Discussed the oxybutynin likely the cause of her memory issues Will stop  Using PTNS

## 2022-03-02 NOTE — Patient Instructions (Signed)
Please stop the oxybutynin and mirtazapine. Cut the cyclobenzaprine in half and only take '5mg'$  at bedtime You can increase the gabapentin slowly to help sleep---even to '600mg'$  Start the duloxetine '30mg'$  daily for the mood issues and nerve pain

## 2022-03-02 NOTE — Assessment & Plan Note (Signed)
Needs pulmonary evaluation

## 2022-03-02 NOTE — Assessment & Plan Note (Signed)
Discussed that the mirtazapine is probably causing her weight gain Will stop and change to duloxetine '30mg'$ 

## 2022-03-02 NOTE — Assessment & Plan Note (Signed)
Will check labs on the levothyroxine 75

## 2022-03-09 LAB — HM DIABETES EYE EXAM

## 2022-03-10 ENCOUNTER — Telehealth: Payer: Self-pay

## 2022-03-10 ENCOUNTER — Institutional Professional Consult (permissible substitution): Payer: Medicare Other | Admitting: Student in an Organized Health Care Education/Training Program

## 2022-03-10 NOTE — Telephone Encounter (Signed)
Okay--glad she got the needed care

## 2022-03-10 NOTE — Telephone Encounter (Signed)
[facility] Night - Client TELEPHONE ADVICE RECORD [facility] Patient Name: Virginia Floyd Gender: Female DOB: 1955/06/24 Age: 66 Y 51 M 29 D Return Phone Number: [phone] (Primary) Address: City/ State/ Zip: [place] [address] Client [facility] Night - Client Client Site [place] Provider Viviana Simpler- MD Contact Type Call Who Is Calling Patient / Member / Family / Caregiver Call Type Triage / Clinical Relationship To Patient Self Return Phone Number [phone] (Primary) Chief Complaint Sore Throat Reason for Call Symptomatic / Request for Health Information Initial Comment Caller developed sore throat yesterday and its gotten worse this morning, she thinks she may have gotten strep from her son. Translation No Nurse Assessment Nurse: Windle Guard, RN, Olin Hauser Date/Time (Eastern Time): 03/10/2022 8:14:23 AM Confirm and document reason for call. If symptomatic, describe symptoms. ---States exposed to strep, has throat pain. Denies fever, difficulty breathing. Does the patient have any new or worsening symptoms? ---Yes Will a triage be completed? ---Yes Related visit to physician within the last 2 weeks? ---No Does the PT have any chronic conditions? (i.e. diabetes, asthma, this includes High risk factors for pregnancy, etc.) ---Yes List chronic conditions. ---DM, Hypothroidism Is this a behavioral health or substance abuse call? ---No Nurse: Windle Guard, RN, Olin Hauser Date/Time (Eastern Time): 03/10/2022 8:22:39 AM Does the patient have any new or worsening symptoms? ---Yes Will a triage be completed? ---Yes Related visit to physician within the last 2 weeks? ---No Does the PT have any chronic conditions? (i.e. diabetes, asthma, this includes High risk factors for pregnancy, etc.) ---Unknown Is this a behavioral health or substance abuse call? ---No PLEASE NOTE: All timestamps contained  within this report are represented as [place] Standard Time. CONFIDENTIALTY NOTICE: This fax transmission is intended only for the addressee. It contains information that is legally privileged, confidential or otherwise protected from use or disclosure. If you are not the intended recipient, you are strictly prohibited from reviewing, disclosing, copying using or disseminating any of this information or taking any action in reliance on or regarding this information. If you have received this fax in error, please notify us immediately by telephone so that we can arrange for its return to [place]. Phone: [phone], Toll-Free: [phone], Fax: [phone] Page: 2 of 2 Call Id: [id number] Guidelines Guideline Title Affirmed Question Affirmed Notes Nurse Date/Time Eilene Ghazi Time) Strep Throat Exposure SEVERE (e.g., excruciating) throat pain Conner, RN, Olin Hauser 03/10/2022 8:17:15 AM Disp. Time Eilene Ghazi Time) Disposition Final User 03/10/2022 8:19:22 AM See PCP within 24 Hours Yes Windle Guard, RN, Olin Hauser Final Disposition 03/10/2022 8:19:22 AM See PCP within 24 Hours Yes Conner, RN, Otho Najjar Disagree/Comply Comply Caller Understands Yes PreDisposition Go to Urgent [place] Advice Given Per Guideline SEE PCP WITHIN 24 HOURS: SORE THROAT: * Sip warm chicken broth or apple juice. * Suck on hard candy or an over-thecounter throat lozenge. * Gargle with warm salt water four times a day. To make salt water, put 1/2 teaspoon of salt in 8 oz (240 ml) of warm water. SOFT DIET: * Eat a soft diet. * Cold drinks, popsicles, and milk shakes are especially good. Avoid citrus fruits. DRINK PLENTY OF LIQUIDS: * Drink plenty of liquids. It is important to stay well-hydrated. PAIN AND FEVER MEDICINES: * IBUPROFEN (E.G., MOTRIN, ADVIL): Take 400 mg (two 200 mg pills) by mouth every 6 hours. The most you should take is 6 pills a day (1,200 mg total). * Before taking any medicine, read all  the  instructions on the package. CALL BACK IF: * You become worse CARE ADVICE given per Strep Throat Exposure (Adult) guideline. Referrals REFERRED TO PCP OFFICE REFERRED TO PCP OFFIC

## 2022-03-10 NOTE — Telephone Encounter (Signed)
I spoke with pt and she went to [facility] in [place]; pt was + for strep and pt was given abx. Sending note to Dr Silvio Pate and Silvio Pate pool.

## 2022-03-11 ENCOUNTER — Ambulatory Visit: Payer: Medicare Other | Admitting: Internal Medicine

## 2022-03-17 ENCOUNTER — Institutional Professional Consult (permissible substitution): Payer: Medicare Other | Admitting: Student in an Organized Health Care Education/Training Program

## 2022-03-18 ENCOUNTER — Encounter: Payer: Self-pay | Admitting: Student in an Organized Health Care Education/Training Program

## 2022-03-18 ENCOUNTER — Ambulatory Visit (INDEPENDENT_AMBULATORY_CARE_PROVIDER_SITE_OTHER): Payer: Medicare Other | Admitting: Student in an Organized Health Care Education/Training Program

## 2022-03-18 VITALS — BP 134/80 | HR 72 | Temp 97.6°F | Ht 62.0 in | Wt 165.8 lb

## 2022-03-18 DIAGNOSIS — R0602 Shortness of breath: Secondary | ICD-10-CM | POA: Diagnosis not present

## 2022-03-18 NOTE — Progress Notes (Signed)
Synopsis: Referred in for shortness of breath by [name], MD  Assessment & Plan:   1. Shortness of breath  Presenting for the evaluation of shortness of breath with a past history of smoking and an otherwise normal lung exam. She does have a history of methane exposure, thou this was over 20 years ago and I do not suspect it is contributing to her symptoms at the moment. I am more concern for COPD and will initiate the workup with pulmonary function testing. Furthermore, I will obtain a CT scan of the chest given the report of nodules in the past as well as the history of lymphoma she is reporting.  - CT CHEST WO CONTRAST; Future - Pulmonary Function Test [facility] Only; Future   Return in about 4 weeks (around 04/15/2022).  I spent 60 minutes caring for this patient today, including preparing to see the patient, obtaining a medical history , reviewing a separately obtained history, performing a medically appropriate examination and/or evaluation, counseling and educating the patient/family/caregiver, ordering medications, tests, or procedures, documenting clinical information in the electronic health record, and independently interpreting results (not separately reported/billed) and communicating results to the patient/family/caregiver  Virginia Reichert, MD [facility] 03/18/2022 4:26 PM    End of visit medications:  No orders of the defined types were placed in this encounter.    Current Outpatient Medications:    atorvastatin (LIPITOR) 40 MG tablet, Take 1 tablet by mouth daily., Disp: , Rfl:    DULoxetine (CYMBALTA) 30 MG capsule, Take 1 capsule (30 mg total) by mouth daily., Disp: 30 capsule, Rfl: 3   gabapentin (NEURONTIN) 100 MG capsule, Take 200 mg by mouth at bedtime., Disp: , Rfl:    glipiZIDE (GLUCOTROL XL) 10 MG 24 hr tablet, Take 10 mg by mouth daily., Disp: , Rfl:    levothyroxine (SYNTHROID) 75 MCG tablet, , Disp: , Rfl:    metFORMIN  (GLUCOPHAGE) 1000 MG tablet, , Disp: , Rfl:    penicillin v potassium (VEETID) 500 MG tablet, Take 500 mg by mouth 2 (two) times daily., Disp: , Rfl:    Subjective:   PATIENT ID: Virginia Floyd GENDER: female DOB: 1955/06/27, MRN: [id number]  Chief Complaint  Patient presents with   Consult    DOE for 6 months. Exposed to [place] gas at work 25 years ago. 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 Wheezing. No cough.    HPI  Virginia Floyd is a pleasant 66 year old female presenting to clinic for the evaluation of shortness of breath.  She reports her symptoms were slowly progressive, and she really noticed the shortness of breath on her recent vacation. She was unable to keep up with her group on their tours. She also noticed significant shortness of breath when attempting to rush to her gate. When she is ambulating slowly, she does not have any limitations. She does not need to go up any stairs. There's no associated cough, sputum production, wheezing, chest pain, or chest tightness. She does not have any rashes, arthritis, or lower extremity swelling.  She does have a history of lymphoma that she reports was fully treated in the past. She has had CT scans in the past to follow up nodules that were stable but is unable to provide more information. She also has a history of type 2 diabetes. She has no other past medical history and denies any personal history of lung disease or asthma. She does report being exposed to [place] gas in  the building she used to work in, over 20 years ago. At that time, she was having shortness of breath. This resolved after cessation of exposure.  She was born in [place], and has lived in [place], [place], [place], and now [place]. She is widowed. She has a son living locally. She used to have pets but hasn't for over a year. She does not currently smoke (quit in 2015 - smoked 0.75 packs a day for 40 years). 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    Ancillary information including prior medications, full medical/surgical/family/social histories, and PFTs (when available) are listed below and have been reviewed.   Review of Systems  Constitutional:  Negative for chills, fever, malaise/fatigue and weight loss.  Respiratory:  Positive for shortness of breath. Negative for cough, hemoptysis, sputum production and wheezing.   Cardiovascular:  Negative for chest pain, leg swelling and PND.     Objective:   Vitals:   03/18/22 1441  BP: 134/80  Pulse: 72  Temp: 97.6 F (36.4 C)  SpO2: 97%  Weight: 165 lb 12.8 oz (75.2 kg)  Height: '5\' 2"'$  (1.575 m)   97% on RA  BMI Readings from Last 3 Encounters:  03/18/22 30.33 kg/m  03/02/22 30.54 kg/m  03/06/21 28.35 kg/m   Wt Readings from Last 3 Encounters:  03/18/22 165 lb 12.8 oz (75.2 kg)  03/02/22 167 lb (75.8 kg)  03/06/21 155 lb (70.3 kg)    Physical Exam Constitutional:      General: She is not in acute distress.    Appearance: Normal appearance. She is not ill-appearing.  Cardiovascular:     Rate and Rhythm: Normal rate and regular rhythm.     Pulses: Normal pulses.     Heart sounds: Normal heart sounds.  Pulmonary:     Effort: Pulmonary effort is normal.     Breath sounds: Normal breath sounds. No wheezing or rales.  Abdominal:     General: There is no distension.     Palpations: Abdomen is soft.  Neurological:     General: No focal deficit present.     Mental Status: She is alert and oriented to person, place, and time. Mental status is at baseline.       Ancillary Information    Past Medical History:  Diagnosis Date   Cervical radiculopathy    Diabetes mellitus without complication ([place])    Hodgkin's lymphoma ([place]) 2008   did well with rituxan   Hyperlipidemia, unspecified hyperlipidemia type    Hypothyroidism    Mood disorder ([place])    mixed anxiety and depression   OAB (overactive bladder)      Family History  Problem Relation Age of Onset    Lupus Mother    Alcohol abuse Father    Cancer Father    Heart disease Sister    Diabetes Sister    Hodgkin's lymphoma Brother    Bladder Cancer Brother    Breast cancer Neg Hx      Past Surgical History:  Procedure Laterality Date   ABDOMINAL HYSTERECTOMY     CHOLECYSTECTOMY     LASIK     LIPOSUCTION     laser --for abdomen   TRIGGER FINGER RELEASE     multiple    Social History   Socioeconomic History   Marital status: Widowed    Spouse name: Not on file   Number of children: 1   Years of education: Not on file   Highest  education level: Not on file  Occupational History   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    Comment: Retired  Tobacco Use   Smoking status: Former    Packs/day: 0.75    Years: 40.00    Total pack years: 30.00    Types: Cigarettes    Quit date: 2015    Years since quitting: 8.9    Passive exposure: Past   Smokeless tobacco: Never  Substance and Sexual Activity   Alcohol use: Not Currently   Drug use: Not Currently   Sexual activity: Not on file  Other Topics Concern   Not on file  Social History Narrative   Has living will   Son Arick should be Media planner   Woould accept resuscitations   Social Determinants of Health   Financial Resource Strain: Not on file  Food Insecurity: Not on file  Transportation Needs: Not on file  Physical Activity: Not on file  Stress: Not on file  Social Connections: Not on file  Intimate Partner Violence: Not on file     Allergies  Allergen Reactions   Latex Swelling   Sulfa Antibiotics Hives, Itching and Nausea And Vomiting   Codeine Hives, Itching and Nausea And Vomiting     CBC    Component Value Date/Time   WBC 6.4 03/02/2022 1136   RBC 4.88 03/02/2022 1136   HGB 14.0 03/02/2022 1136   HCT 42.0 03/02/2022 1136   PLT 168.0 03/02/2022 1136   MCV 86.0 03/02/2022 1136   MCH 27.3 03/06/2021 1053   MCHC 33.3 03/02/2022 1136   RDW 15.0 03/02/2022 1136    Pulmonary  Functions Testing Results:     No data to display          Outpatient Medications Prior to Visit  Medication Sig Dispense Refill   atorvastatin (LIPITOR) 40 MG tablet Take 1 tablet by mouth daily.     DULoxetine (CYMBALTA) 30 MG capsule Take 1 capsule (30 mg total) by mouth daily. 30 capsule 3   gabapentin (NEURONTIN) 100 MG capsule Take 200 mg by mouth at bedtime.     glipiZIDE (GLUCOTROL XL) 10 MG 24 hr tablet Take 10 mg by mouth daily.     levothyroxine (SYNTHROID) 75 MCG tablet      metFORMIN (GLUCOPHAGE) 1000 MG tablet      penicillin v potassium (VEETID) 500 MG tablet Take 500 mg by mouth 2 (two) times daily.     BIOTIN PO Take by mouth.     No facility-administered medications prior to visit.

## 2022-03-23 ENCOUNTER — Encounter: Payer: Self-pay | Admitting: Internal Medicine

## 2022-03-24 ENCOUNTER — Ambulatory Visit: Payer: Medicare Other

## 2022-03-24 ENCOUNTER — Ambulatory Visit
Admission: RE | Admit: 2022-03-24 | Discharge: 2022-03-24 | Disposition: A | Discharge status: Home / Self Care | Payer: Medicare Other | Source: Ambulatory Visit | Attending: Family Medicine | Admitting: Family Medicine

## 2022-03-24 DIAGNOSIS — Z1231 Encounter for screening mammogram for malignant neoplasm of breast: Secondary | ICD-10-CM

## 2022-03-25 ENCOUNTER — Ambulatory Visit
Admission: RE | Admit: 2022-03-25 | Discharge: 2022-03-25 | Disposition: A | Discharge status: Home / Self Care | Payer: Medicare Other | Source: Ambulatory Visit | Attending: Internal Medicine | Admitting: Internal Medicine

## 2022-03-25 DIAGNOSIS — R0602 Shortness of breath: Secondary | ICD-10-CM

## 2022-04-01 ENCOUNTER — Ambulatory Visit (INDEPENDENT_AMBULATORY_CARE_PROVIDER_SITE_OTHER): Payer: Medicare Other | Admitting: Internal Medicine

## 2022-04-01 ENCOUNTER — Encounter: Payer: Self-pay | Admitting: Internal Medicine

## 2022-04-01 VITALS — BP 124/78 | HR 78 | Temp 97.3°F | Ht 62.0 in | Wt 166.0 lb

## 2022-04-01 DIAGNOSIS — E039 Hypothyroidism, unspecified: Secondary | ICD-10-CM | POA: Diagnosis not present

## 2022-04-01 DIAGNOSIS — E119 Type 2 diabetes mellitus without complications: Secondary | ICD-10-CM

## 2022-04-01 DIAGNOSIS — F39 Unspecified mood [affective] disorder: Secondary | ICD-10-CM | POA: Diagnosis not present

## 2022-04-01 DIAGNOSIS — G479 Sleep disorder, unspecified: Secondary | ICD-10-CM | POA: Insufficient documentation

## 2022-04-01 LAB — TSH: TSH: 5.68 u[IU]/mL — ABNORMAL HIGH (ref 0.35–5.50)

## 2022-04-01 LAB — T4, FREE: Free T4: 0.8 ng/dL (ref 0.60–1.60)

## 2022-04-01 MED ORDER — LEVOTHYROXINE SODIUM 75 MCG PO TABS: 75.0000 ug | ORAL_TABLET | Freq: Every day | ORAL | 0 refills | Status: DC

## 2022-04-01 MED ORDER — LEVOTHYROXINE SODIUM 75 MCG PO TABS: 75.0000 ug | ORAL_TABLET | Freq: Every day | ORAL | 3 refills | Status: DC

## 2022-04-01 NOTE — Assessment & Plan Note (Signed)
Melatonin 10 some help Recommended trying the gabapentin up to '400mg'$  at bedtime rather than needing regular acetaminophen PM

## 2022-04-01 NOTE — Assessment & Plan Note (Signed)
Doing well on the duloxetine 30

## 2022-04-01 NOTE — Assessment & Plan Note (Signed)
Seems to still have adequate control No change for now

## 2022-04-01 NOTE — Assessment & Plan Note (Signed)
Will recheck labs off the biotin

## 2022-04-01 NOTE — Progress Notes (Signed)
Subjective:    Patient ID: Virginia Floyd, female    DOB: 04-Oct-1955, 66 y.o.   MRN: [id number]  HPI Here for follow up on mood issues and med changes  Biggest issue is sleep problems Has tried melatonin '10mg'$ --didn't help.  When adding acetaminophen PM--she does sleep okay  Did notice some decline in appetite off the mirtazapine Lost some weight--but now stabilized Avoiding the junk at night  Mood is okay No problems with duloxetine Tough time of year---grieving more with holliays and limited time with family  Sugars have been okay No recent changes Friend suggested THC gummies-tried 1/4 at bedtime. On another trial, BP went up, so she is done with that  Current Outpatient Medications on File Prior to Visit  Medication Sig Dispense Refill   atorvastatin (LIPITOR) 40 MG tablet Take 1 tablet by mouth daily.     DULoxetine (CYMBALTA) 30 MG capsule Take 1 capsule (30 mg total) by mouth daily. 30 capsule 3   gabapentin (NEURONTIN) 100 MG capsule Take 200 mg by mouth at bedtime.     glipiZIDE (GLUCOTROL XL) 10 MG 24 hr tablet Take 10 mg by mouth daily.     levothyroxine (SYNTHROID) 75 MCG tablet      metFORMIN (GLUCOPHAGE) 1000 MG tablet      No current facility-administered medications on file prior to visit.    Allergies  Allergen Reactions   Latex Swelling   Sulfa Antibiotics Hives, Itching and Nausea And Vomiting   Codeine Hives, Itching and Nausea And Vomiting    Past Medical History:  Diagnosis Date   Cervical radiculopathy    Diabetes mellitus without complication ([place])    Hodgkin's lymphoma ([place]) 2008   did well with rituxan   Hyperlipidemia, unspecified hyperlipidemia type    Hypothyroidism    Mood disorder ([place])    mixed anxiety and depression   OAB (overactive bladder)     Past Surgical History:  Procedure Laterality Date   ABDOMINAL HYSTERECTOMY     CHOLECYSTECTOMY     LASIK     LIPOSUCTION     laser --for abdomen   TRIGGER FINGER RELEASE      multiple    Family History  Problem Relation Age of Onset   Lupus Mother    Alcohol abuse Father    Cancer Father    Heart disease Sister    Diabetes Sister    Hodgkin's lymphoma Brother    Bladder Cancer Brother    Breast cancer Neg Hx     Social History   Socioeconomic History   Marital status: Widowed    Spouse name: Not on file   Number of children: 1   Years of education: Not on file   Highest education level: Not on file  Occupational History   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    Comment: Retired  Tobacco Use   Smoking status: Former    Packs/day: 0.75    Years: 40.00    Total pack years: 30.00    Types: Cigarettes    Quit date: 2015    Years since quitting: 8.9    Passive exposure: Past   Smokeless tobacco: Never  Substance and Sexual Activity   Alcohol use: Not Currently   Drug use: Not Currently   Sexual activity: Not on file  Other Topics Concern   Not on file  Social History Narrative   Has living will   Son Virginia Floyd should be Media planner   Woould accept resuscitations   Social Determinants  of Health   Financial Resource Strain: Not on file  Food Insecurity: Not on file  Transportation Needs: Not on file  Physical Activity: Not on file  Stress: Not on file  Social Connections: Not on file  Intimate Partner Violence: Not on file   Review of Systems Does feel evening gabapentin helped shoulder pain She didn't like higher dose though (up to '400mg'$ --but no true side effects) Off the oxybutynin---no change in voiding. Still uses the PTNS     Objective:   Physical Exam Constitutional:      Appearance: Normal appearance.  Neurological:     Mental Status: She is alert.  Psychiatric:        Mood and Affect: Mood normal.        Behavior: Behavior normal.            Assessment & Plan:

## 2022-04-23 ENCOUNTER — Ambulatory Visit: Payer: Medicare Other | Attending: Internal Medicine

## 2022-04-23 DIAGNOSIS — Z87891 Personal history of nicotine dependence: Secondary | ICD-10-CM | POA: Insufficient documentation

## 2022-04-23 DIAGNOSIS — R0602 Shortness of breath: Secondary | ICD-10-CM | POA: Diagnosis not present

## 2022-04-23 LAB — PULMONARY FUNCTION TEST ARMC ONLY
DL/VA % pred: 81 %
DL/VA: 3.42 ml/min/mmHg/L
DLCO unc % pred: 85 %
DLCO unc: 15.61 ml/min/mmHg
FEF 25-75 Post: 2 L/sec
FEF 25-75 Pre: 1.56 L/sec
FEF2575-%Change-Post: 28 %
FEF2575-%Pred-Post: 106 %
FEF2575-%Pred-Pre: 82 %
FEV1-%Change-Post: 7 %
FEV1-%Pred-Post: 86 %
FEV1-%Pred-Pre: 80 %
FEV1-Post: 1.85 L
FEV1-Pre: 1.73 L
FEV1FVC-%Change-Post: 1 %
FEV1FVC-%Pred-Pre: 103 %
FEV6-%Change-Post: 5 %
FEV6-%Pred-Post: 85 %
FEV6-%Pred-Pre: 81 %
FEV6-Post: 2.3 L
FEV6-Pre: 2.19 L
FEV6FVC-%Pred-Post: 104 %
FEV6FVC-%Pred-Pre: 104 %
FVC-%Change-Post: 5 %
FVC-%Pred-Post: 81 %
FVC-%Pred-Pre: 77 %
FVC-Post: 2.3 L
FVC-Pre: 2.19 L
Post FEV1/FVC ratio: 80 %
Post FEV6/FVC ratio: 100 %
Pre FEV1/FVC ratio: 79 %
Pre FEV6/FVC Ratio: 100 %
RV % pred: 110 %
RV: 2.26 L
TLC % pred: 103 %
TLC: 4.9 L

## 2022-04-23 MED ORDER — ALBUTEROL SULFATE (2.5 MG/3ML) 0.083% IN NEBU
2.5000 mg | INHALATION_SOLUTION | Freq: Once | RESPIRATORY_TRACT | Status: AC
  Administered 2022-04-23: 2.5 mg via RESPIRATORY_TRACT
  Filled 2022-04-23: qty 3

## 2022-04-29 ENCOUNTER — Telehealth: Payer: Self-pay | Admitting: Internal Medicine

## 2022-04-29 NOTE — Telephone Encounter (Signed)
Left message on VM asking pt to verify whether she needed any refills right now.

## 2022-04-29 NOTE — Telephone Encounter (Signed)
Patient stopped by and stated that all of her medication is to be sent to mail order [place], [place] Phone: [phone]  Fax: [phone]     going forward,all of her prescribed medication. But anything called in for sicknesses should be sent to  [facility] [id number] [facility], [place] Phone: [phone]  Fax: [phone]     As well as all of her medications should be in 90 day quantity and not 30.

## 2022-04-30 ENCOUNTER — Ambulatory Visit (INDEPENDENT_AMBULATORY_CARE_PROVIDER_SITE_OTHER): Payer: Medicare Other | Admitting: Student in an Organized Health Care Education/Training Program

## 2022-04-30 ENCOUNTER — Encounter: Payer: Self-pay | Admitting: Student in an Organized Health Care Education/Training Program

## 2022-04-30 VITALS — BP 144/60 | HR 87 | Temp 97.7°F | Ht 62.0 in | Wt 162.2 lb

## 2022-04-30 DIAGNOSIS — R0602 Shortness of breath: Secondary | ICD-10-CM

## 2022-04-30 MED ORDER — ALBUTEROL SULFATE HFA 108 (90 BASE) MCG/ACT IN AERS: 2.0000 | INHALATION_SPRAY | RESPIRATORY_TRACT | 2 refills | Status: DC | PRN

## 2022-04-30 NOTE — Progress Notes (Signed)
Assessment & Plan:   1. Shortness of breath  Presenting for the evaluation of shortness of breath in the setting of smoking history and normal lung exam. PFT's performed were overall normal with no signs of COPD. There is some mild obstruction at low lung volumes on the flow volume loop, and given patient's improvement with albuterol during PFT's, will prescribe a trial of albuterol. If she continues to benefit from it, will attempt a trial of a long acting inhaler.  Patient reports interest in referral to lung cancer screening. Chest CT performed December of 2023 was within normal with no nodules or masses. Her next scan would be in December of 2024, referral placed.  Finally, I did discuss with Ms. Copenhaver the importance of being vaccinated given her age and smoking history. Did counsel her on influenza, COVID, and pneumococcal vaccination. Patient will think about it but is not interested at this moment.  - albuterol (VENTOLIN HFA) 108 (90 Base) MCG/ACT inhaler; Inhale 2 puffs into the lungs every 4 (four) hours as needed for wheezing or shortness of breath.  Dispense: 8 g; Refill: 2 - Ambulatory Referral for Lung Cancer Scre  Return in about 1 year (around 05/01/2023).  I spent 30 minutes caring for this patient today, including preparing to see the patient, obtaining a medical history , reviewing a separately obtained history, performing a medically appropriate examination and/or evaluation, counseling and educating the patient/family/caregiver, ordering medications, tests, or procedures, and documenting clinical information in the electronic health record  [name], MD [facility] 04/30/2022 1:46 PM    End of visit medications:  Meds ordered this encounter  Medications   albuterol (VENTOLIN HFA) 108 (90 Base) MCG/ACT inhaler    Sig: Inhale 2 puffs into the lungs every 4 (four) hours as needed for wheezing or shortness of breath.    Dispense:  8 g     Refill:  2     Current Outpatient Medications:    albuterol (VENTOLIN HFA) 108 (90 Base) MCG/ACT inhaler, Inhale 2 puffs into the lungs every 4 (four) hours as needed for wheezing or shortness of breath., Disp: 8 g, Rfl: 2   atorvastatin (LIPITOR) 40 MG tablet, Take 1 tablet by mouth daily., Disp: , Rfl:    DULoxetine (CYMBALTA) 30 MG capsule, Take 1 capsule (30 mg total) by mouth daily., Disp: 30 capsule, Rfl: 3   gabapentin (NEURONTIN) 100 MG capsule, Take 200 mg by mouth at bedtime., Disp: , Rfl:    glipiZIDE (GLUCOTROL XL) 10 MG 24 hr tablet, Take 10 mg by mouth daily., Disp: , Rfl:    levothyroxine (SYNTHROID) 75 MCG tablet, Take 1 tablet (75 mcg total) by mouth daily., Disp: 30 tablet, Rfl: 0   metFORMIN (GLUCOPHAGE) 1000 MG tablet, , Disp: , Rfl:    Subjective:   PATIENT ID: Virginia Floyd GENDER: female DOB: 21-Apr-1955, MRN: [id number]  Chief Complaint  Patient presents with   Follow-up    Breathing is baseline since last OV. SOB with exertion and wheezing.     HPI  Virginia Floyd is a pleasant 67 year old female presenting to clinic for follow up on shortness of breath.  Overall she feels well with no change in symptoms. She continues to report shortness of breath with exertion. She underwent pulmonary function testing and reports improvement when received the albuterol.  During our prior visit, she had reported slowly progressive symptoms of shortness of breath, especially when she attempted [place] airport. When she is ambulating slowly, she does not have any limitations. She does not need to go up any stairs. There's no associated cough, sputum production, wheezing, chest pain, or chest tightness. She does not have any rashes, arthritis, or lower extremity swelling.   She does have a history of lymphoma that she reports was fully treated in the past. She has had CT scans in the past to follow up nodules that were stable but is unable to provide more  information. She also has a history of type 2 diabetes. She has no other past medical history and denies any personal history of lung disease or asthma. She does report being exposed to [place] gas in the building she used to work in, over 20 years ago. At that time, she was having shortness of breath. This resolved after cessation of exposure.   She was born in [place], and has lived in [place], [place], [place], and now [place]. She is widowed. She has a son living locally. She used to have pets but hasn't for over a year. She does not currently smoke (quit in 2015 - smoked 0.75 packs a day for 40 years). 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   Ancillary information including prior medications, full medical/surgical/family/social histories, and PFTs (when available) are listed below and have been reviewed.   Review of Systems  Constitutional:  Negative for chills, fever and malaise/fatigue.  Respiratory:  Positive for shortness of breath. Negative for cough, sputum production and wheezing.   Cardiovascular:  Negative for chest pain.  Skin:  Negative for rash.  Psychiatric/Behavioral:  Negative for depression.      Objective:   Vitals:   04/30/22 1325  BP: (!) 144/60  Pulse: 87  Temp: 97.7 F (36.5 C)  TempSrc: Temporal  SpO2: 96%  Weight: 162 lb 3.2 oz (73.6 kg)  Height: '5\' 2"'$  (1.575 m)   96% on RA  BMI Readings from Last 3 Encounters:  04/30/22 29.67 kg/m  04/01/22 30.36 kg/m  03/18/22 30.33 kg/m   Wt Readings from Last 3 Encounters:  04/30/22 162 lb 3.2 oz (73.6 kg)  04/01/22 166 lb (75.3 kg)  03/18/22 165 lb 12.8 oz (75.2 kg)    Physical Exam Constitutional:      General: She is not in acute distress.    Appearance: Normal appearance. She is not ill-appearing.  Cardiovascular:     Rate and Rhythm: Normal rate and regular rhythm.     Pulses: Normal pulses.     Heart sounds: Normal heart sounds.  Pulmonary:     Effort: Pulmonary effort  is normal.     Breath sounds: Normal breath sounds. No wheezing or rales.  Abdominal:     General: There is no distension.     Palpations: Abdomen is soft.  Neurological:     General: No focal deficit present.     Mental Status: She is alert and oriented to person, place, and time. Mental status is at baseline.       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   OAB (overactive bladder)      Family History  Problem Relation Age of  Onset   Lupus Mother    Alcohol abuse Father    Cancer Father    Heart disease Sister    Diabetes Sister    Hodgkin's lymphoma Brother    Bladder Cancer Brother    Breast cancer Neg Hx      Past Surgical History:  Procedure Laterality Date   ABDOMINAL HYSTERECTOMY     CHOLECYSTECTOMY     LASIK     LIPOSUCTION     laser --for abdomen   TRIGGER FINGER RELEASE     multiple    Social History   Socioeconomic History   Marital status: Widowed    Spouse name: Not on file   Number of children: 1   Years of education: Not on file   Highest education level: Not on file  Occupational History   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    Comment: Retired  Tobacco Use   Smoking status: Former    Packs/day: 0.75    Years: 40.00    Total pack years: 30.00    Types: Cigarettes    Quit date: 2015    Years since quitting: 9.0    Passive exposure: Past   Smokeless tobacco: Never  Substance and Sexual Activity   Alcohol use: Not Currently   Drug use: Not Currently   Sexual activity: Not on file  Other Topics Concern   Not on file  Social History Narrative   Has living will   Son Arick should be Media planner   Woould accept resuscitations   Social  Determinants of Health   Financial Resource Strain: Not on file  Food Insecurity: Not on file  Transportation Needs: Not on file  Physical Activity: Not on file  Stress: Not on file  Social Connections: Not on file  Intimate Partner Violence: Not on file     Allergies  Allergen Reactions   Latex Swelling   Sulfa Antibiotics Hives, Itching and Nausea And Vomiting   Codeine Hives, Itching and Nausea And Vomiting     CBC    Component Value Date/Time   WBC 6.4 03/02/2022 1136   RBC 4.88 03/02/2022 1136   HGB 14.0 03/02/2022 1136   HCT 42.0 03/02/2022 1136   PLT 168.0 03/02/2022 1136   MCV 86.0 03/02/2022 1136   MCH 27.3 03/06/2021 1053   MCHC 33.3 03/02/2022 1136   RDW 15.0 03/02/2022 1136    Pulmonary Functions Testing Results:    Latest Ref Rng & Units 04/23/2022    1:48 PM  PFT Results  FVC-Pre L 2.19   FVC-Predicted Pre % 77   FVC-Post L 2.30   FVC-Predicted Post % 81   Pre FEV1/FVC % % 79   Post FEV1/FCV % % 80   FEV1-Pre L 1.73   FEV1-Predicted Pre % 80   FEV1-Post L 1.85   DLCO uncorrected ml/min/mmHg 15.61   DLCO UNC% % 85   DLVA Predicted % 81   TLC L 4.90   TLC % Predicted % 103   RV % Predicted % 110     Outpatient Medications Prior to Visit  Medication Sig Dispense Refill   atorvastatin (LIPITOR) 40 MG tablet Take 1 tablet by mouth daily.     DULoxetine (CYMBALTA) 30 MG capsule Take 1 capsule (30 mg total) by mouth daily. 30 capsule 3   gabapentin (NEURONTIN) 100 MG capsule Take 200 mg by mouth at bedtime.     glipiZIDE (GLUCOTROL XL) 10 MG 24 hr tablet Take 10 mg by mouth daily.  levothyroxine (SYNTHROID) 75 MCG tablet Take 1 tablet (75 mcg total) by mouth daily. 30 tablet 0   metFORMIN (GLUCOPHAGE) 1000 MG tablet      No facility-administered medications prior to visit.

## 2022-05-08 ENCOUNTER — Telehealth: Payer: Self-pay

## 2022-05-08 MED ORDER — DULOXETINE HCL 30 MG PO CPEP: 30.0000 mg | ORAL_CAPSULE | Freq: Every day | ORAL | 3 refills | Status: DC

## 2022-05-08 NOTE — Telephone Encounter (Signed)
Rx sent electronically.  

## 2022-05-25 ENCOUNTER — Telehealth: Payer: Self-pay | Admitting: Internal Medicine

## 2022-05-25 ENCOUNTER — Other Ambulatory Visit: Payer: Self-pay

## 2022-05-25 MED ORDER — METFORMIN HCL 1000 MG PO TABS: 1000.0000 mg | ORAL_TABLET | Freq: Two times a day (BID) | ORAL | 3 refills | Status: DC

## 2022-05-25 MED ORDER — GABAPENTIN 300 MG PO CAPS: 300.0000 mg | ORAL_CAPSULE | Freq: Every day | ORAL | 3 refills | Status: DC

## 2022-05-25 NOTE — Telephone Encounter (Signed)
Prescription Request  05/25/2022  Is this a "Controlled Substance" medicine? No  LOV: 04/01/2022  What is the name of the medication or equipment?  metFORMIN (GLUCOPHAGE) 500 MG ER tablet twice a day gabapentin (NEURONTIN), She said it was incresed from 246m to 3052m Have you contacted your pharmacy to request a refill? Yes   Which pharmacy would you like this sent to?  [id number]: [phone]: [phone] Patient notified that their request is being sent to the clinical staff for review and that they should receive a response within 2 business days.   Please advise at Mobile [phone])

## 2022-05-26 NOTE — Telephone Encounter (Signed)
Called and notified patient of refills sent in.

## 2022-06-24 ENCOUNTER — Telehealth: Payer: Self-pay | Admitting: Internal Medicine

## 2022-06-24 MED ORDER — ATORVASTATIN CALCIUM 40 MG PO TABS: 40.0000 mg | ORAL_TABLET | Freq: Every day | ORAL | 3 refills | Status: DC

## 2022-06-24 NOTE — Telephone Encounter (Signed)
Prescription Request  06/24/2022  LOV: 04/01/2022  What is the name of the medication or equipment? atorvastatin (LIPITOR) 40 MG tablet   Have you contacted your pharmacy to request a refill? Yes   Which pharmacy would you like this sent to [place], [place] [address] Phone: [phone] Fax: [phone]    Patient notified that their request is being sent to the clinical staff for review and that they should receive a response within 2 business days.   Please advise at Mobile [phone] (mobile)  Pt would like all her none urgent RX be sent to [facility]

## 2022-06-24 NOTE — Telephone Encounter (Signed)
Rx sent electronically.  

## 2022-06-29 ENCOUNTER — Ambulatory Visit (INDEPENDENT_AMBULATORY_CARE_PROVIDER_SITE_OTHER): Payer: Medicare Other | Admitting: Internal Medicine

## 2022-06-29 VITALS — BP 136/68 | HR 71 | Temp 97.3°F | Ht 62.0 in | Wt 159.0 lb

## 2022-06-29 DIAGNOSIS — E119 Type 2 diabetes mellitus without complications: Secondary | ICD-10-CM | POA: Diagnosis not present

## 2022-06-29 DIAGNOSIS — R350 Frequency of micturition: Secondary | ICD-10-CM | POA: Diagnosis not present

## 2022-06-29 LAB — POC URINALSYSI DIPSTICK (AUTOMATED)
Bilirubin, UA: NEGATIVE
Blood, UA: NEGATIVE
Glucose, UA: NEGATIVE
Ketones, UA: NEGATIVE
Leukocytes, UA: NEGATIVE
Nitrite, UA: NEGATIVE
Protein, UA: NEGATIVE
Spec Grav, UA: 1.025 (ref 1.010–1.025)
Urobilinogen, UA: 0.2 E.U./dL
pH, UA: 5.5 (ref 5.0–8.0)

## 2022-06-29 MED ORDER — CEPHALEXIN 500 MG PO CAPS: 500.0000 mg | ORAL_CAPSULE | Freq: Four times a day (QID) | ORAL | 0 refills | Status: DC

## 2022-06-29 NOTE — Assessment & Plan Note (Signed)
No glycosuria on test

## 2022-06-29 NOTE — Assessment & Plan Note (Signed)
And pressure at end of stream Urinalysis is negative Could have isolated urethral or bladder bacteria without the urine involved Will treat with cephalexin 500mg  tid for 3 days

## 2022-06-29 NOTE — Progress Notes (Signed)
Subjective:    Patient ID: Virginia Floyd, female    DOB: [birth year], 67 y.o.   MRN: [id number]  HPI Here due to urinary symptoms  Having pressure at the end of voiding and urgency Had recent cold--not COVID (mostly sinus) Took some cold medication Needed the inhaler briefly  No burning dysuria No hematuria  Current Outpatient Medications on File Prior to Visit  Medication Sig Dispense Refill   albuterol (VENTOLIN HFA) 108 (90 Base) MCG/ACT inhaler Inhale 2 puffs into the lungs every 4 (four) hours as needed for wheezing or shortness of breath. 8 g 2   atorvastatin (LIPITOR) 40 MG tablet Take 1 tablet (40 mg total) by mouth daily. 90 tablet 3   DULoxetine (CYMBALTA) 30 MG capsule Take 1 capsule (30 mg total) by mouth daily. 90 capsule 3   gabapentin (NEURONTIN) 300 MG capsule Take 1 capsule (300 mg total) by mouth at bedtime. 90 capsule 3   glipiZIDE (GLUCOTROL XL) 10 MG 24 hr tablet Take 10 mg by mouth daily.     levothyroxine (SYNTHROID) 75 MCG tablet Take 1 tablet (75 mcg total) by mouth daily. 30 tablet 0   metFORMIN (GLUCOPHAGE) 1000 MG tablet Take 1 tablet (1,000 mg total) by mouth 2 (two) times daily with a meal. 180 tablet 3   No current facility-administered medications on file prior to visit.    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   OAB (overactive bladder)     Past Surgical History:  Procedure Laterality Date   ABDOMINAL HYSTERECTOMY     CHOLECYSTECTOMY     LASIK     LIPOSUCTION     laser --for abdomen   TRIGGER FINGER RELEASE     multiple     Family History  Problem Relation Age of Onset   Lupus Mother    Alcohol abuse Father    Cancer Father    Heart disease Sister    Diabetes Sister    Hodgkin's lymphoma Brother    Bladder Cancer Brother    Breast cancer Neg Hx     Social History   Socioeconomic History   Marital status: Widowed    Spouse name: Not on file   Number of children: 1   Years of education: Not on file   Highest education level: Master's degree (e.g., MA, MS, MEng, MEd, MSW, MBA)  Occupational History   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    Comment: Retired  Tobacco Use   Smoking status: Former    Packs/day: 0.75    Years: 40.00    Additional pack years: 0.00    Total pack years: 30.00    Types: Cigarettes    Quit date: 2015    Years since quitting: 9.2    Passive exposure: Past   Smokeless tobacco: Never  Substance and Sexual Activity   Alcohol use: Not Currently   Drug use: Not Currently   Sexual activity: Not on file  Other Topics Concern   Not on file  Social History Narrative   Has living will  Son Nadara Eaton should be decision maker   Woould accept resuscitations   Social Determinants of Health   Financial Resource Strain: Low Risk  (06/29/2022)   Overall Financial Resource Strain (CARDIA)    Difficulty of Paying Living Expenses: Not hard at all  Food Insecurity: No Food Insecurity (06/29/2022)   Hunger Vital Sign    Worried About Running Out of Food in the Last Year: Never true    [place] in the Last Year: Never true  Transportation Needs: No Transportation Needs (06/29/2022)   PRAPARE - Hydrologist (Medical): No    Lack of Transportation (Non-Medical): No  Physical Activity: Unknown (06/29/2022)   Exercise Vital Sign    Days of Exercise per Week: 0 days    Minutes of Exercise per Session: Not on file  Stress: Stress Concern Present (06/29/2022)   [place]     Feeling of Stress : To some extent  Social Connections: Moderately Isolated (06/29/2022)   Social Connection and Isolation Panel [NHANES]    Frequency of Communication with Friends and Family: More than three times a week    Frequency of Social Gatherings with Friends and Family: More than three times a week    Attends Religious Services: Never    Marine scientist or Organizations: Yes    Attends Music therapist: More than 4 times per year    Marital Status: Widowed  Human resources officer Violence: Not on file   Review of Systems Some nausea for several weeks---?related to cold or cold medicines No fever    Objective:   Physical Exam Constitutional:      Appearance: Normal appearance.  Abdominal:     Palpations: Abdomen is soft.     Tenderness: There is no abdominal tenderness. There is no right CVA tenderness or left CVA tenderness.  Neurological:     Mental Status: She is alert.            Assessment & Plan:

## 2022-07-08 ENCOUNTER — Encounter: Payer: Self-pay | Admitting: Internal Medicine

## 2022-07-15 ENCOUNTER — Telehealth: Payer: Self-pay | Admitting: Internal Medicine

## 2022-07-15 MED ORDER — GLIPIZIDE ER 10 MG PO TB24: 10.0000 mg | ORAL_TABLET | Freq: Every day | ORAL | 0 refills | Status: DC

## 2022-07-15 NOTE — Telephone Encounter (Signed)
Spoke to pt. Rx was sent to [facility].

## 2022-07-15 NOTE — Telephone Encounter (Signed)
Patient called in and stated that [facility] sent her prescription for glipiZIDE (GLUCOTROL XL) 10 MG 24 hr tablet but the post office lost the medication. She stated that a new script was suppose to be sent over to [facility] but they contacted the wrong provider for the medication. She is needing a short script to be sent in to [facility] [id number] [place], [place] - [address] If she doesn't have the deliver by Friday [facility] will send out another 90 day supply for her. Please advise. Thank you!

## 2022-07-21 ENCOUNTER — Other Ambulatory Visit: Payer: Self-pay | Admitting: Internal Medicine

## 2022-08-04 ENCOUNTER — Telehealth: Payer: Self-pay | Admitting: Internal Medicine

## 2022-08-04 MED ORDER — GABAPENTIN 400 MG PO CAPS: 400.0000 mg | ORAL_CAPSULE | Freq: Every day | ORAL | 3 refills | Status: DC

## 2022-08-04 NOTE — Telephone Encounter (Signed)
Prescription Request  08/04/2022  LOV: 06/29/2022  What is the name of the medication or equipment? gabapentin (NEURONTIN) 300 MG capsule   Have you contacted your pharmacy to request a refill? Yes   Which pharmacy would you like this sent to?  [facility] - [place], [place] - [address] [place] [address] Phone: [phone] Fax: [phone]    Patient notified that their request is being sent to the clinical staff for review and that they should receive a response within 2 business days.   Please advise at Mobile [phone] (mobile)

## 2022-08-04 NOTE — Telephone Encounter (Signed)
Rx sent Okay with increase to 400mg 

## 2022-08-04 NOTE — Telephone Encounter (Signed)
Spoke to pt. She said she has been taking 400 mg nightly for almost 3 months now using a rx from [facility], [place] or [facility]. Also taking melatonin. Said the combo is working.   Send to [facility] if agree to change to 400 mg.

## 2022-09-06 ENCOUNTER — Encounter: Payer: Self-pay | Admitting: Emergency Medicine

## 2022-09-06 ENCOUNTER — Ambulatory Visit
Admission: EM | Admit: 2022-09-06 | Discharge: 2022-09-06 | Disposition: A | Discharge status: Home / Self Care | Payer: Medicare Other | Attending: Family Medicine | Admitting: Family Medicine

## 2022-09-06 DIAGNOSIS — N3001 Acute cystitis with hematuria: Secondary | ICD-10-CM

## 2022-09-06 LAB — URINALYSIS, W/ REFLEX TO CULTURE (INFECTION SUSPECTED)
Glucose, UA: NEGATIVE mg/dL
Nitrite: NEGATIVE
Protein, ur: 100 mg/dL — AB
RBC / HPF: >50 RBC/hpf (ref 0–5)
Specific Gravity, Urine: >1.03 — ABNORMAL HIGH (ref 1.005–1.030)
pH: 5.5 (ref 5.0–8.0)

## 2022-09-06 MED ORDER — CEPHALEXIN 500 MG PO CAPS: 500.0000 mg | ORAL_CAPSULE | Freq: Four times a day (QID) | ORAL | 0 refills | Status: AC

## 2022-09-06 MED ORDER — FLUCONAZOLE 150 MG PO TABS: 150.0000 mg | ORAL_TABLET | ORAL | 0 refills | Status: AC

## 2022-09-06 NOTE — Discharge Instructions (Addendum)
Stop by the pharmacy to pick up your prescriptions.  I sent urine for culture if you need to change antibiotics we will contact you.

## 2022-09-06 NOTE — ED Provider Notes (Signed)
[facility]    CSN: [id number] Arrival date & time: 09/06/22  0847      History   Chief Complaint Chief Complaint  Patient presents with   Urinary Frequency   Dysuria     HPI HPI Virginia Floyd is a 67 y.o. female.    Virginia Floyd presents for dysuria, urinary frequency and abdominal pressure yesterday.  She has small amounts of blood on the tissue this mroning. Tried  nothing prior to arrival.  Has  not had any antibiotics in last 30 days.   Denies known STI exposure. No history of kidney stones or recurrent kidney stones.   - Abnormal vaginal discharge: no - vaginal odor: no - vaginal bleeding: no - Dysuria: yes  - Hematuria: yes  - Urinary urgency:no  - Urinary frequency: yes   - Fever: no - Abdominal pain: yes  - Pelvic pain: no - Rash/Skin lesions/mouth ulcers: no - Nausea: no  - Vomiting: no  - Back Pain: no        Past Medical History:  Diagnosis Date   Cervical radiculopathy    Diabetes mellitus without complication (HCC)    Hodgkin's lymphoma (HCC) 2008   did well with rituxan   Hyperlipidemia, unspecified hyperlipidemia type    Hypothyroidism    Mood disorder (HCC)    mixed anxiety and depression   OAB (overactive bladder)     Patient Active Problem List   Diagnosis Date Noted   Urinary frequency 06/29/2022   Sleep disorder 04/01/2022   Mood disorder (HCC) 03/02/2022   Hyperlipidemia, unspecified hyperlipidemia type 03/02/2022   Cervical radiculopathy 03/02/2022   Hypothyroidism 03/02/2022   OAB (overactive bladder) 03/02/2022   Diabetes mellitus without complication (HCC) 03/02/2022   DOE (dyspnea on exertion) 03/02/2022   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      Home Medications    Prior to Admission medications   Medication Sig Start Date End Date Taking? Authorizing Provider  atorvastatin (LIPITOR) 40 MG tablet Take 1 tablet (40 mg total) by mouth daily. 06/24/22  Yes Karie Schwalbe, MD  fluconazole (DIFLUCAN) 150 MG tablet Take 1 tablet (150 mg total) by mouth every 3 (three) days for 2 doses. 09/06/22 09/10/22 Yes Lacey Wallman, DO  gabapentin (NEURONTIN) 400 MG capsule Take 1 capsule (400 mg total) by mouth at bedtime. 08/04/22  Yes Karie Schwalbe, MD  glipiZIDE (GLUCOTROL XL) 10 MG 24 hr tablet Take 1 tablet (10 mg total) by mouth daily. 07/15/22  Yes Karie Schwalbe, MD  levothyroxine (SYNTHROID) 75 MCG tablet Take 1 tablet (75 mcg total) by mouth daily. 04/01/22  Yes Karie Schwalbe, MD  metFORMIN (GLUCOPHAGE) 1000 MG tablet Take 1 tablet (1,000 mg total) by mouth 2 (two) times daily with a meal. 05/25/22  Yes Karie Schwalbe, MD  albuterol (VENTOLIN HFA) 108 (90 Base) MCG/ACT inhaler Inhale 2 puffs into the lungs every 4 (four) hours as needed for wheezing or shortness of breath. 04/30/22   Raechel Chute, MD  cephALEXin (KEFLEX) 500 MG capsule Take 1 capsule (500 mg total) by mouth 4 (four) times daily for 5 days. 09/06/22 09/11/22  Katha Cabal, DO  DULoxetine (CYMBALTA) 30 MG capsule Take 1 capsule (30 mg total) by mouth daily. 05/08/22   Karie Schwalbe, MD    Family History Family History  Problem Relation Age of Onset   Lupus Mother    Alcohol abuse Father    Cancer Father    Heart disease Sister    Diabetes Sister    Hodgkin's lymphoma Brother    Bladder Cancer Brother    Breast cancer Neg Hx     Social History Social History   Tobacco Use   Smoking status: Former    Packs/day: 0.75    Years: 40.00    Additional pack years: 0.00    Total pack years: 30.00    Types: Cigarettes     Quit date: 2015    Years since quitting: 9.4    Passive exposure: Past   Smokeless tobacco: Never  Vaping Use   Vaping Use: Never used  Substance Use Topics   Alcohol use: Not Currently   Drug use: Not Currently     Allergies   Latex, Sulfa antibiotics, and Codeine   Review of Systems Review of Systems: :negative unless otherwise stated in HPI.      Physical Exam Triage Vital Signs ED Triage Vitals  Enc Vitals Group     BP 09/06/22 0939 (!) 135/57     Pulse Rate 09/06/22 0939 77     Resp 09/06/22 0939 14     Temp 09/06/22 0939 98.3 F (36.8 C)     Temp Source 09/06/22 0939 Oral     SpO2 09/06/22 0939 98 %     Weight 09/06/22 0937 154 lb (69.9 kg)     Height 09/06/22 0937 5\' 2"  (1.575 m)     Head Circumference --      Peak Flow --      Pain Score 09/06/22 0937 4     Pain Loc --      Pain Edu? --      Excl. in GC? --    No data found.  Updated Vital Signs BP (!) 135/57 (BP Location: Left Arm)   Pulse 77   Temp 98.3 F (36.8 C) (Oral)   Resp 14   Ht 5\' 2"  (1.575 m)   Wt 69.9 kg   SpO2 98%   BMI 28.17 kg/m   Visual Acuity Right Eye Distance:   Left Eye Distance:   Bilateral Distance:    Right Eye Near:   Left Eye Near:    Bilateral Near:     Physical Exam GEN: well appearing female in no acute distress  CVS: well perfused  RESP: speaking in full sentences without pause  ABD: soft, non-tender, non-distended, no palpable masses, no CVA tenderness   SKIN: warm, dry    UC Treatments / Results  Labs (all labs ordered are listed, but only abnormal results are displayed) Labs Reviewed  URINALYSIS, W/ REFLEX TO CULTURE (INFECTION SUSPECTED) - Abnormal; Notable for the following components:      Result Value   APPearance CLOUDY (*)    Specific Gravity, Urine >1.030 (*)    Hgb urine dipstick LARGE (*)    Bilirubin Urine SMALL (*)    Ketones, ur TRACE (*)    Protein, ur 100 (*)    Leukocytes,Ua SMALL (*)    Bacteria, UA FEW (*)  All other  components within normal limits  URINE CULTURE    EKG   Radiology No results found.  Procedures Procedures (including critical care time)  Medications Ordered in UC Medications - No data to display  Initial Impression / Assessment and Plan / UC Course  I have reviewed the triage vital signs and the nursing notes.  Pertinent labs & imaging results that were available during my care of the patient were reviewed by me and considered in my medical decision making (see chart for details).     Acute cystitis:  Patient is a 67 y.o. female  who presents for 1 day of dysuria and urinary frequency.  Overall patient is well-appearing and afebrile.  Vital signs stable.  UA consistent with acute cystitis.  Hematuria supported on microscopy.  Treat with Keflex 4 times daily for 5 days.  Urine culture obtained.  Follow-up sensitivities and change antibiotics, if needed. Return precautions including abdominal pain, fever, chills, nausea, or vomiting given. Follow-up,  if symptoms not improving or getting worse. Discussed MDM, treatment plan and plan for follow-up with patient who agrees with plan.        Final Clinical Impressions(s) / UC Diagnoses   Final diagnoses:  Acute cystitis with hematuria     Discharge Instructions      Stop by the pharmacy to pick up your prescriptions.  I sent urine for culture if you need to change antibiotics we will contact you.      ED Prescriptions     Medication Sig Dispense Auth. Provider   cephALEXin (KEFLEX) 500 MG capsule Take 1 capsule (500 mg total) by mouth 4 (four) times daily for 5 days. 20 capsule Julio Storr, DO   fluconazole (DIFLUCAN) 150 MG tablet Take 1 tablet (150 mg total) by mouth every 3 (three) days for 2 doses. 2 tablet Katha Cabal, DO      PDMP not reviewed this encounter.   Katha Cabal, DO 09/06/22 1007

## 2022-09-06 NOTE — ED Triage Notes (Signed)
Patient c/o dysuria and urinary frequency that started early this morning.  Patient reports pressure in her bladder.  Patient denies fevers.

## 2022-09-07 ENCOUNTER — Other Ambulatory Visit: Payer: Self-pay | Admitting: Family

## 2022-09-07 ENCOUNTER — Telehealth: Payer: Self-pay

## 2022-09-07 MED ORDER — NITROFURANTOIN MONOHYD MACRO 100 MG PO CAPS: 100.0000 mg | ORAL_CAPSULE | Freq: Two times a day (BID) | ORAL | 0 refills | Status: DC

## 2022-09-07 NOTE — Telephone Encounter (Signed)
Per chart review tab pt seen [facility] [place] on 09/07/22. Sending note to Worthy Rancher FNP since Dr Alphonsus Sias is out of office.

## 2022-09-07 NOTE — Telephone Encounter (Signed)
Spoke to pt. She said no one from Access Nurse called her back yesterday. Had to go to [facility] for treatment. She was given an antibiotic there. SHe does not need the antibiotic Pad[facility] sent in.   I am going to forward this message to our managers as a RN never called the pt to advise her what to do over the weekend.

## 2022-09-08 LAB — URINE CULTURE: Culture: 60000 — AB

## 2022-09-09 NOTE — Telephone Encounter (Signed)
Been taking antibiotic since Sunday,  cephALEXin (KEFLEX) 500 MG capsule   Took one of diflucan, has one more (does she need more?  Needs macrobid called into:  [facility] [id number] [place], [place] - [address] Phone: [phone]  Fax: [phone]     No blood in urine, but still has pressure  Patient okay to receive messages on my chart  Also, wants to know if she should continue taking  cephALEXin (KEFLEX) 500 MG capsule

## 2022-09-09 NOTE — Telephone Encounter (Signed)
I spoke with pt; I apologized for any confusion or communication issues. I explained to pt according to documentation access nurse attempted to call pt back on 09/06/22 at 8:07 am, 8:17 am and 08: 38 am but did not get an answer. Again I apologized and pt said she was leaving for [place] for a funeral and pt went to [facility] [place] and was prescribed abx; pt picked up abx before leaving town and pt said she got acall from UC that pt needed a different abx due to urine culture results showing staph in urine. Pt lives in [place] and does not want to drive to [place] to pick up new abx rx.  I spoke with Darl Pikes at [facility] and she will call [facility] [place] and get macrobid transferred to [facility] [employer] and H/T will notify pt when ready for pick up. Pt voiced understanding and appreciative. Pt will cb if symptoms do not clear completely. Pt will take 2nd diflucan as prescribed. Pt said not seeing blood in urine now.pt does still have pressure feeling. UC & ED precautions given and pt voiced understanding.sending note to Dr Alphonsus Sias and Alphonsus Sias pool as Lorain Childes.

## 2022-09-09 NOTE — Telephone Encounter (Signed)
Left pt a message to see how she was doing since the cephalexin was not on the list of sensitivities or resistance. If she is still having issues, we can send in the macrobid to a different pharmacy.

## 2022-09-16 NOTE — Telephone Encounter (Signed)
Called patient was able to take all of cephalexin with no issues. She denies any current symptoms and will give [place] a call if any changes or questions.

## 2022-09-17 NOTE — Telephone Encounter (Signed)
Cephalexin wouldn't be optimal for the Staph in her culture---but no further action needed if her symptoms have resolved.

## 2022-10-08 ENCOUNTER — Encounter: Payer: Self-pay | Admitting: Nurse Practitioner

## 2022-10-08 ENCOUNTER — Telehealth: Payer: Medicare Other | Admitting: Nurse Practitioner

## 2022-10-08 DIAGNOSIS — J0140 Acute pansinusitis, unspecified: Secondary | ICD-10-CM | POA: Diagnosis not present

## 2022-10-08 DIAGNOSIS — H1033 Unspecified acute conjunctivitis, bilateral: Secondary | ICD-10-CM | POA: Diagnosis not present

## 2022-10-08 MED ORDER — OFLOXACIN 0.3 % OP SOLN: 1.0000 [drp] | Freq: Four times a day (QID) | OPHTHALMIC | 0 refills | Status: AC

## 2022-10-08 MED ORDER — AMOXICILLIN-POT CLAVULANATE 875-125 MG PO TABS: 1.0000 | ORAL_TABLET | Freq: Two times a day (BID) | ORAL | 0 refills | Status: DC

## 2022-10-08 MED ORDER — FLUTICASONE PROPIONATE 50 MCG/ACT NA SUSP: 2.0000 | Freq: Every day | NASAL | 6 refills | Status: DC

## 2022-10-08 NOTE — Progress Notes (Signed)
Virtual Visit Consent   Virginia Floyd, you are scheduled for a virtual visit with a [facility] provider today. Just as with appointments in the office, your consent must be obtained to participate. Your consent will be active for this visit and any virtual visit you may have with one of our providers in the next 365 days. If you have a MyChart account, a copy of this consent can be sent to you electronically.  As this is a virtual visit, video technology does not allow for your provider to perform a traditional examination. This may limit your provider's ability to fully assess your condition. If your provider identifies any concerns that need to be evaluated in person or the need to arrange testing (such as labs, EKG, etc.), we will make arrangements to do so. Although advances in technology are sophisticated, we cannot ensure that it will always work on either your end or our end. If the connection with a video visit is poor, the visit may have to be switched to a telephone visit. With either a video or telephone visit, we are not always able to ensure that we have a secure connection.  By engaging in this virtual visit, you consent to the provision of healthcare and authorize for your insurance to be billed (if applicable) for the services provided during this visit. Depending on your insurance coverage, you may receive a charge related to this service.  I need to obtain your verbal consent now. Are you willing to proceed with your visit today? Virginia Floyd has provided verbal consent on 10/08/2022 for a virtual visit (video or telephone). Viviano Simas, FNP  Date: 10/08/2022 8:55 AM  Virtual Visit via Video Note   I, Viviano Simas, connected with  Virginia Floyd  ([id number], [birth year]) on 10/08/22 at  9:15 AM EDT by a video-enabled telemedicine application and verified that I am speaking with the correct person using two identifiers.  Location: Patient: Virtual Visit Location Patient:  Home Provider: Virtual Visit Location Provider: Home Office   I discussed the limitations of evaluation and management by telemedicine and the availability of in person appointments. The patient expressed understanding and agreed to proceed.    History of Present Illness: Virginia Floyd is a 67 y.o. who identifies as a female who was assigned female at birth, and is being seen today redness in her eyes and swelling with discharge.   First notes eye drainage this morning   Over the past week she has nasal congestion that has been staying the same without improvement or worsening symptoms.   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   History of lymphoma 06/01/2015   Nerve pain 06/01/2015   Hodgkin's lymphoma (HCC) 2008    Allergies:  Allergies  Allergen Reactions   Latex Swelling   Sulfa Antibiotics Hives, Itching and Nausea And Vomiting   Codeine Hives, Itching and Nausea And Vomiting  Medications:  Current Outpatient Medications:    nitrofurantoin, macrocrystal-monohydrate, (MACROBID) 100 MG capsule, Take 1 capsule (100 mg total) by mouth 2 (two) times daily., Disp: 10 capsule, Rfl: 0   albuterol (VENTOLIN HFA) 108 (90 Base) MCG/ACT inhaler, Inhale 2 puffs into the lungs every 4 (four) hours as needed for wheezing or shortness of breath., Disp: 8 g, Rfl: 2   atorvastatin  (LIPITOR) 40 MG tablet, Take 1 tablet (40 mg total) by mouth daily., Disp: 90 tablet, Rfl: 3   DULoxetine (CYMBALTA) 30 MG capsule, Take 1 capsule (30 mg total) by mouth daily., Disp: 90 capsule, Rfl: 3   gabapentin (NEURONTIN) 400 MG capsule, Take 1 capsule (400 mg total) by mouth at bedtime., Disp: 90 capsule, Rfl: 3   glipiZIDE (GLUCOTROL XL) 10 MG 24 hr tablet, Take 1 tablet (10 mg total) by mouth daily., Disp: 10 tablet, Rfl: 0   levothyroxine (SYNTHROID) 75 MCG tablet, Take 1 tablet (75 mcg total) by mouth daily., Disp: 30 tablet, Rfl: 0   metFORMIN (GLUCOPHAGE) 1000 MG tablet, Take 1 tablet (1,000 mg total) by mouth 2 (two) times daily with a meal., Disp: 180 tablet, Rfl: 3  Observations/Objective: Patient is well-developed, well-nourished in no acute distress.  Resting comfortably  at home.  Head is normocephalic, atraumatic.  No labored breathing.  Speech is clear and coherent with logical content.  Patient is alert and oriented at baseline.    Assessment and Plan: 1. Acute bacterial conjunctivitis of both eyes  - ofloxacin (OCUFLOX) 0.3 % ophthalmic solution; Place 1 drop into both eyes 4 (four) times daily for 5 days.  Dispense: 5 mL; Refill: 0  2. Acute non-recurrent pansinusitis  - amoxicillin-clavulanate (AUGMENTIN) 875-125 MG tablet; Take 1 tablet by mouth 2 (two) times daily.  Dispense: 20 tablet; Refill: 0 - fluticasone (FLONASE) 50 MCG/ACT nasal spray; Place 2 sprays into both nostrils daily.  Dispense: 16 g; Refill: 6     Follow Up Instructions: I discussed the assessment and treatment plan with the patient. The patient was provided an opportunity to ask questions and all were answered. The patient agreed with the plan and demonstrated an understanding of the instructions.  A copy of instructions were sent to the patient via MyChart unless otherwise noted below.    The patient was advised to call back or seek an in-person evaluation if the symptoms worsen or if the  condition fails to improve as anticipated.  Time:  I spent 15 minutes with the patient via telehealth technology discussing the above problems/concerns.    Viviano Simas, FNP

## 2022-10-12 ENCOUNTER — Ambulatory Visit: Payer: Medicare Other | Admitting: Internal Medicine

## 2022-10-15 ENCOUNTER — Encounter: Payer: Self-pay | Admitting: Internal Medicine

## 2022-10-15 ENCOUNTER — Ambulatory Visit (INDEPENDENT_AMBULATORY_CARE_PROVIDER_SITE_OTHER): Payer: Medicare Other | Admitting: Internal Medicine

## 2022-10-15 VITALS — BP 130/80 | HR 82 | Temp 97.3°F | Ht 62.0 in | Wt 155.0 lb

## 2022-10-15 DIAGNOSIS — Z7984 Long term (current) use of oral hypoglycemic drugs: Secondary | ICD-10-CM

## 2022-10-15 DIAGNOSIS — E119 Type 2 diabetes mellitus without complications: Secondary | ICD-10-CM | POA: Diagnosis not present

## 2022-10-15 DIAGNOSIS — F39 Unspecified mood [affective] disorder: Secondary | ICD-10-CM | POA: Diagnosis not present

## 2022-10-15 DIAGNOSIS — M7130 Other bursal cyst, unspecified site: Secondary | ICD-10-CM | POA: Insufficient documentation

## 2022-10-15 DIAGNOSIS — C8108 Nodular lymphocyte predominant Hodgkin lymphoma, lymph nodes of multiple sites: Secondary | ICD-10-CM

## 2022-10-15 LAB — POCT GLYCOSYLATED HEMOGLOBIN (HGB A1C): Hemoglobin A1C: 6.8 % — AB (ref 4.0–5.6)

## 2022-10-15 LAB — CBC
HCT: 42.2 % (ref 36.0–46.0)
Hemoglobin: 13.9 g/dL (ref 12.0–15.0)
MCHC: 32.9 g/dL (ref 30.0–36.0)
MCV: 86 fl (ref 78.0–100.0)
Platelets: 198 10*3/uL (ref 150.0–400.0)
RBC: 4.91 Mil/uL (ref 3.87–5.11)
RDW: 14.6 % (ref 11.5–15.5)
WBC: 9 10*3/uL (ref 4.0–10.5)

## 2022-10-15 NOTE — Assessment & Plan Note (Signed)
Does okay with the duloxetine 30mg 

## 2022-10-15 NOTE — Assessment & Plan Note (Signed)
Lab Results  Component Value Date   HGBA1C 6.8 (A) 10/15/2022   Still with good control on the metformin 1000 bid and glipizide 10 daily

## 2022-10-15 NOTE — Progress Notes (Signed)
Subjective:    Patient ID: Virginia Floyd, female    DOB: [birth year], 67 y.o.   MRN: [id number]  HPI Here with concern about her toe--and Hodgkins  Has pea like hard mass at DIP of right great toe Noticed it in past couple of weeks Doesn't move No pain  Just finishing augmentin for sinusitis Symptoms are better  Not checking sugars much Careful about carbs, etc Water aerobics---but on hold due to right shoulder problems (saw ortho and had MRI yesterday)  Requests CBC---seems to have had more respiratory infections lately Wants recheck from her Hodgkin's  Current Outpatient Medications on File Prior to Visit  Medication Sig Dispense Refill   albuterol (VENTOLIN HFA) 108 (90 Base) MCG/ACT inhaler Inhale 2 puffs into the lungs every 4 (four) hours as needed for wheezing or shortness of breath. 8 g 2   amoxicillin-clavulanate (AUGMENTIN) 875-125 MG tablet Take 1 tablet by mouth 2 (two) times daily. 20 tablet 0   atorvastatin (LIPITOR) 40 MG tablet Take 1 tablet (40 mg total) by mouth daily. 90 tablet 3   fluticasone (FLONASE) 50 MCG/ACT nasal spray Place 2 sprays into both nostrils daily. 16 g 6   gabapentin (NEURONTIN) 400 MG capsule Take 1 capsule (400 mg total) by mouth at bedtime. 90 capsule 3   glipiZIDE (GLUCOTROL XL) 10 MG 24 hr tablet Take 1 tablet (10 mg total) by mouth daily. 10 tablet 0   levothyroxine (SYNTHROID) 75 MCG tablet Take 1 tablet (75 mcg total) by mouth daily. 30 tablet 0   metFORMIN (GLUCOPHAGE) 1000 MG tablet Take 1 tablet (1,000 mg total) by mouth 2 (two) times daily with a meal. 180 tablet 3   No current facility-administered medications on file prior to visit.    Allergies  Allergen Reactions   Latex Swelling   Sulfa Antibiotics Hives, Itching and Nausea And Vomiting   Codeine Hives, Itching and Nausea And Vomiting    Past Medical History:  Diagnosis Date   Cervical radiculopathy    Diabetes mellitus without complication (HCC)    Hodgkin's  lymphoma (HCC) 2008   did well with rituxan   Hyperlipidemia, unspecified hyperlipidemia type    Hypothyroidism    Mood disorder (HCC)    mixed anxiety and depression   OAB (overactive bladder)     Past Surgical History:  Procedure Laterality Date   ABDOMINAL HYSTERECTOMY     CHOLECYSTECTOMY     LASIK     LIPOSUCTION     laser --for abdomen   TRIGGER FINGER RELEASE     multiple    Family History  Problem Relation Age of Onset   Lupus Mother    Alcohol abuse Father    Cancer Father    Heart disease Sister    Diabetes Sister    Hodgkin's lymphoma Brother    Bladder Cancer Brother    Breast cancer Neg Hx     Social History   Socioeconomic History   Marital status: Widowed    Spouse name: Not on file   Number of children: 1   Years of education: Not on file   Highest education level: Master's degree (e.g., MA, MS, MEng, MEd, MSW, MBA)  Occupational History   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    Comment: Retired  Tobacco Use   Smoking status: Former    Current packs/day: 0.00    Average packs/day: 0.8 packs/day for 40.0 years (30.0 ttl pk-yrs)    Types: Cigarettes    Start date: 80  Quit date: 2015    Years since quitting: 9.5    Passive exposure: Past   Smokeless tobacco: Never  Vaping Use   Vaping status: Never Used  Substance and Sexual Activity   Alcohol use: Not Currently   Drug use: Not Currently   Sexual activity: Not on file  Other Topics Concern   Not on file  Social History Narrative   Has living will   Son Wynelle Cleveland should be decision maker   Woould accept resuscitations   Social Determinants of Health   Financial Resource Strain: Low Risk  (06/29/2022)   Overall Financial Resource Strain (CARDIA)    Difficulty of Paying Living Expenses: Not hard at all  Food Insecurity: No Food Insecurity (06/29/2022)   Hunger Vital Sign    Worried About Running Out of Food in the Last Year: Never true    Ran Out of Food in the Last Year:  Never true  Transportation Needs: No Transportation Needs (06/29/2022)   PRAPARE - Administrator, Civil Service (Medical): No    Lack of Transportation (Non-Medical): No  Physical Activity: Unknown (06/29/2022)   Exercise Vital Sign    Days of Exercise per Week: 0 days    Minutes of Exercise per Session: Not on file  Stress: Stress Concern Present (06/29/2022)   [employer] of Occupational Health - Occupational Stress Questionnaire    Feeling of Stress : To some extent  Social Connections: Moderately Isolated (06/29/2022)   Social Connection and Isolation Panel [NHANES]    Frequency of Communication with Friends and Family: More than three times a week    Frequency of Social Gatherings with Friends and Family: More than three times a week    Attends Religious Services: Never    Database administrator or Organizations: Yes    Attends Engineer, structural: More than 4 times per year    Marital Status: Widowed  Catering manager Violence: Not on file       Review of Systems Appetite is fine Weight is down a few pounds--trying Sleeps okay with melatonin No chest pain or SOB---has rescue inhaler which she hasn't needed     Objective:   Physical Exam Constitutional:      Appearance: Normal appearance.  Cardiovascular:     Rate and Rhythm: Normal rate and regular rhythm.     Pulses: Normal pulses.     Heart sounds: No murmur heard.    No gallop.  Pulmonary:     Effort: Pulmonary effort is normal.     Breath sounds: Normal breath sounds. No wheezing or rales.  Musculoskeletal:     Cervical back: Neck supple.     Comments: Tiny synovial cyst at DIP right great toe  Lymphadenopathy:     Cervical: No cervical adenopathy.  Skin:    Comments: No foot lesions  Neurological:     Mental Status: She is alert.  Psychiatric:        Mood and Affect: Mood normal.        Behavior: Behavior normal.            Assessment & Plan:

## 2022-10-15 NOTE — Assessment & Plan Note (Signed)
No action unless pain or gets bigger

## 2022-10-15 NOTE — Assessment & Plan Note (Addendum)
Has been in remission Will recheck CBC No longer sees hematologist

## 2022-10-25 IMAGING — MR MR CERVICAL SPINE W/O CM
5 series · 43 of 48 positions shown · non-contrast
Comparison: Radiographs of the cervical spine 01/02/2021.

CLINICAL DATA: Provided history: Cervical spondylosis without
myelopathy. Additional history provided by scanning technologist:
worsening over the last few months, bilateral shoulder pain with
right arm pain.

EXAM:
MRI CERVICAL SPINE WITHOUT CONTRAST
TECHNIQUE: Multiplanar, multisequence MR imaging of the cervical spine was
performed. No intravenous contrast was administered.

[Series 5: T1 · sagittal · 3.0mm · 0.86mm/px · 6 of 12 slices shown (1 of 2)]
[im 1/12]
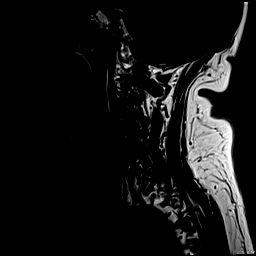
[im 3/12]
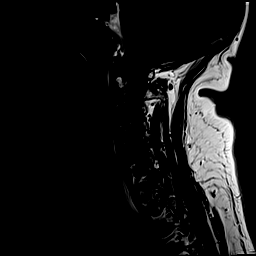
[im 5/12]
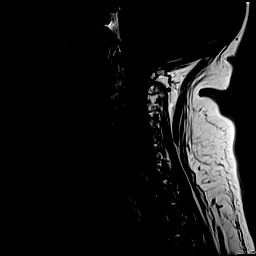
[im 7/12]
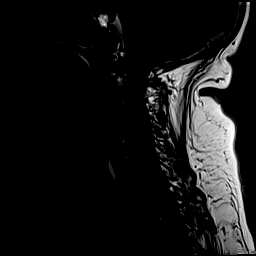
[im 9/12]
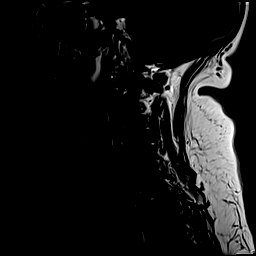
[im 12/12]
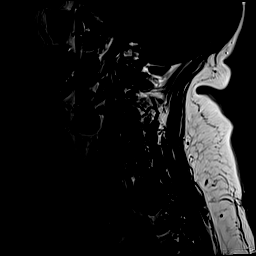

[Series 6: STIR · sagittal · 3.0mm · 0.34mm/px · 6 of 12 slices shown]
[im 1/12]
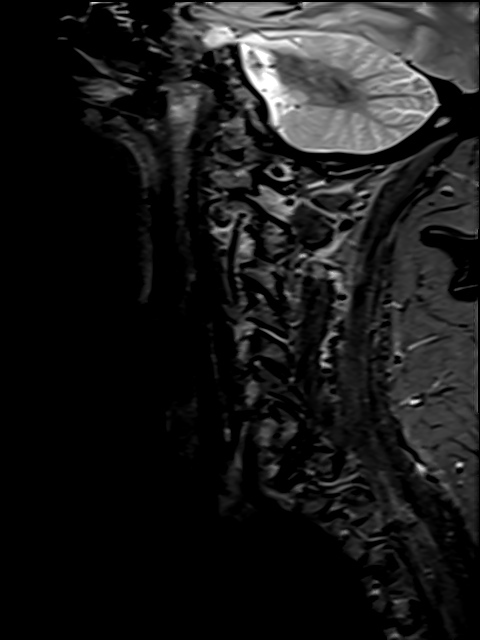
[im 3/12]
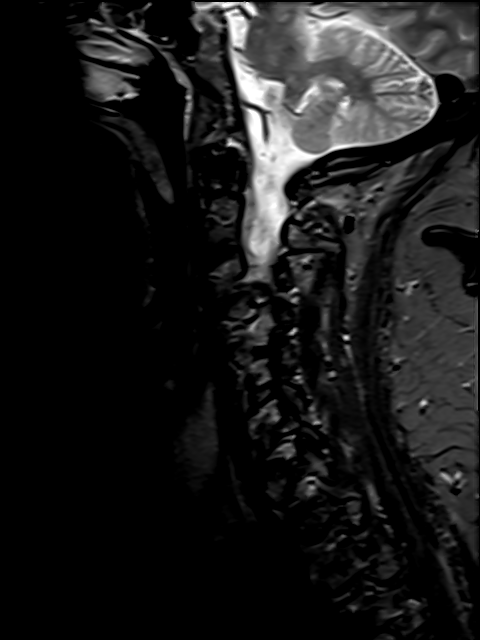
[im 5/12]
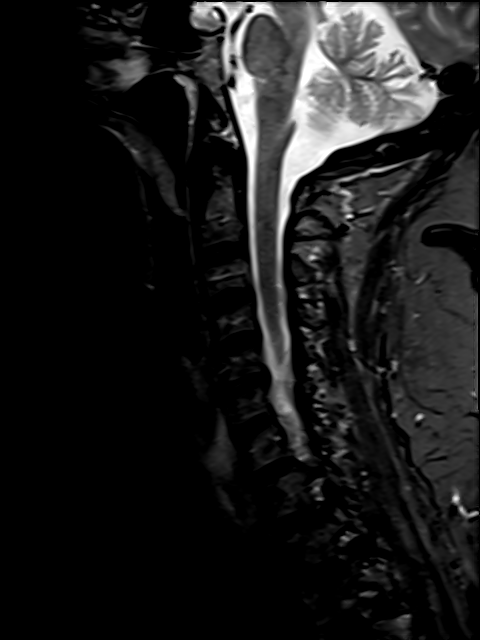
[im 7/12]
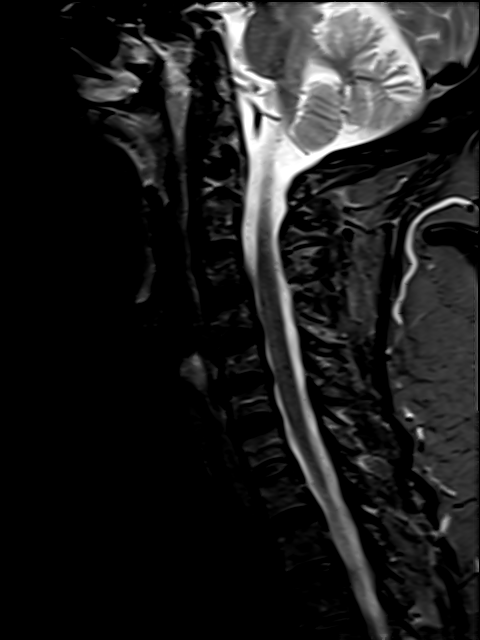
[im 9/12]
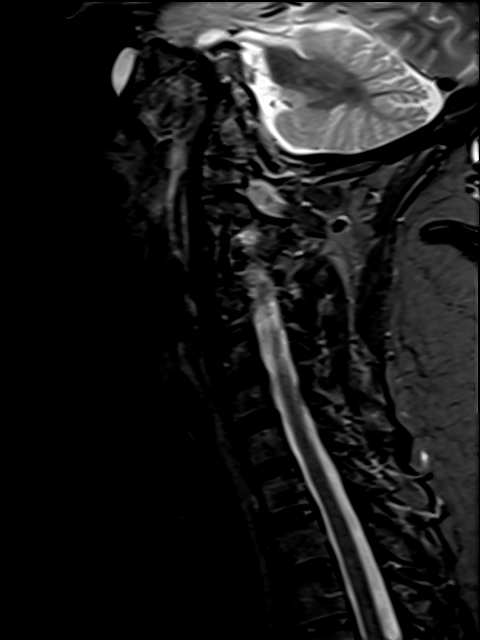
[im 12/12]
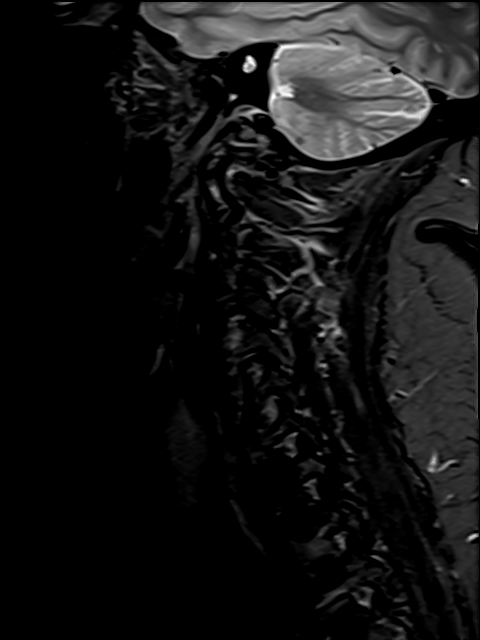

[Series 7: T2 · sagittal · 3.0mm · 0.69mm/px · 6 of 12 slices shown (1 of 2)]
[im 1/12]
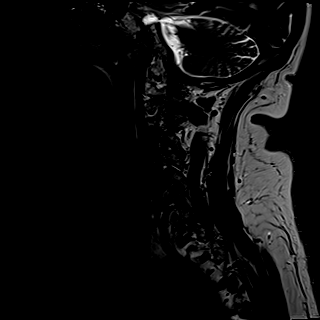
[im 3/12]
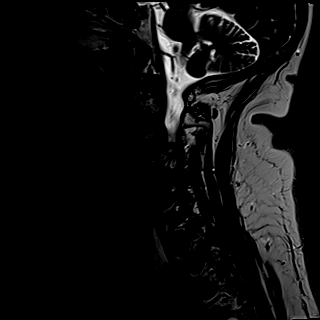
[im 5/12]
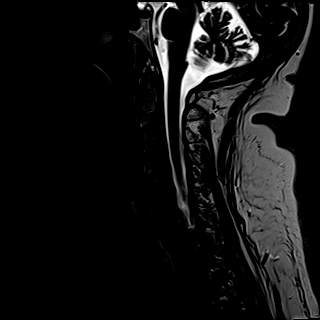
[im 7/12]
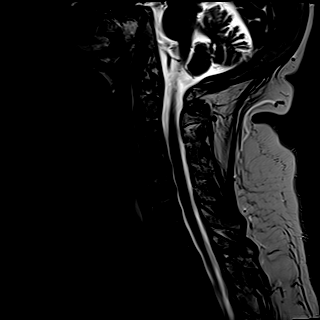
[im 9/12]
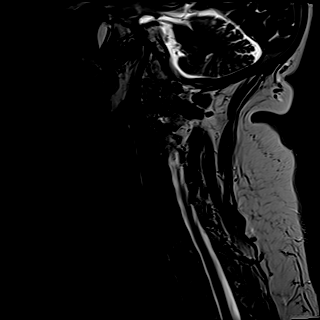
[im 12/12]
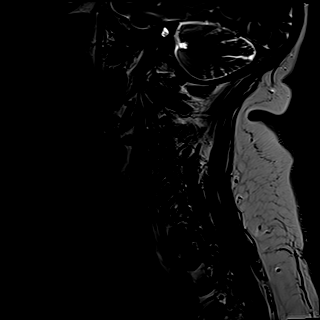

[Series 8: T2 · axial · 3.0mm · 0.70mm/px · z∈[-55,+46]mm · 15 of 32 slices shown (2 of 2)]
[im 1/32]
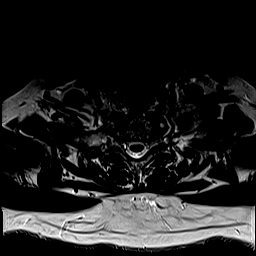
[im 3/32]
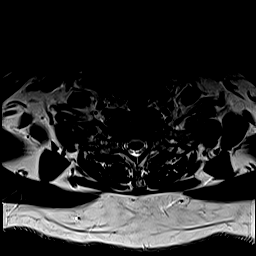
[im 5/32]
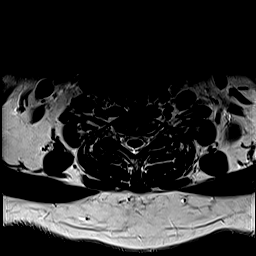
[im 7/32]
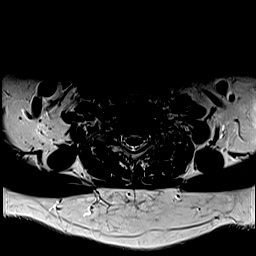
[im 9/32]
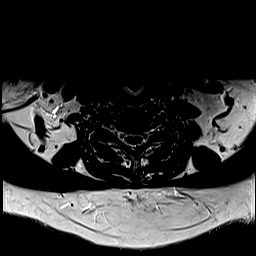
[im 12/32]
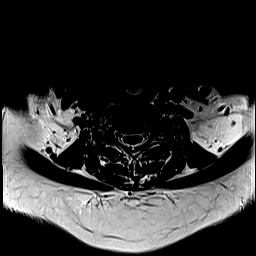
[im 14/32]
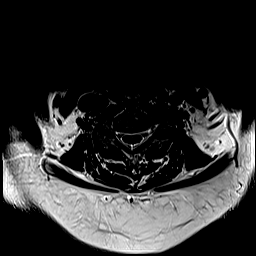
[im 16/32]
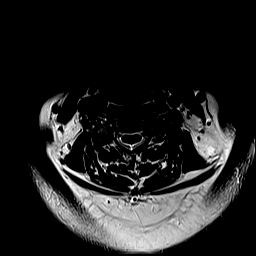
[im 18/32]
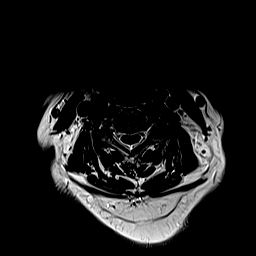
[im 20/32]
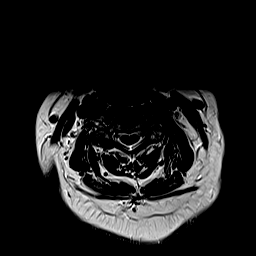
[im 23/32]
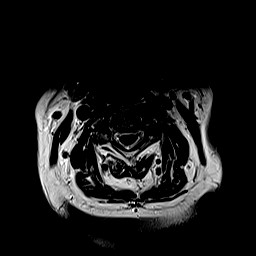
[im 25/32]
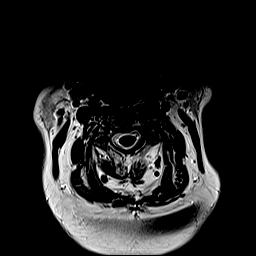
[im 27/32]
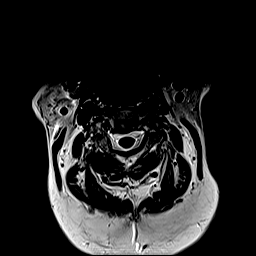
[im 29/32]
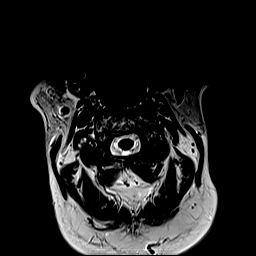
[im 32/32]
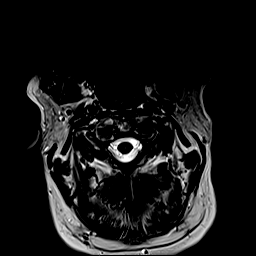

[Series 9: T1 · axial · 3.0mm · 0.70mm/px · z∈[-55,+46]mm · 10 of 32 slices shown (2 of 2)]
[im 1/32]
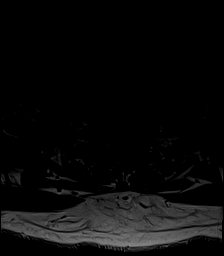
[im 3/32]
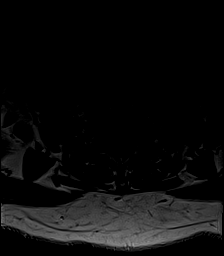
[im 5/32]
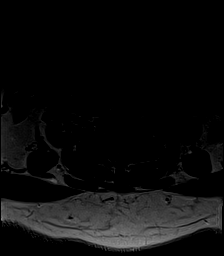
[im 9/32]
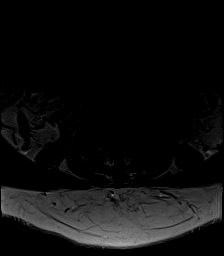
[im 14/32]
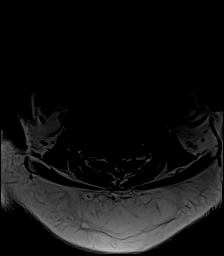
[im 16/32]
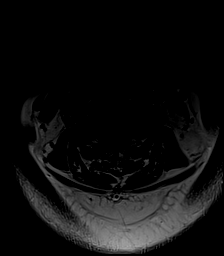
[im 18/32]
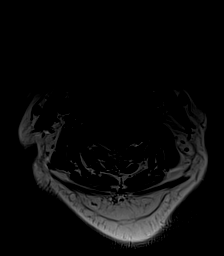
[im 23/32]
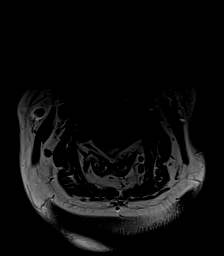
[im 27/32]
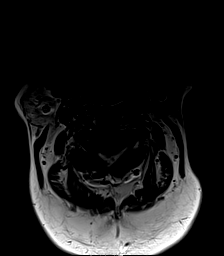
[im 32/32]
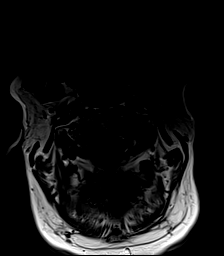

[43 of 48 positions shown; findings below may reference images not displayed]

FINDINGS: Mild intermittent motion degradation.

Alignment: Straightening of the expected cervical lordosis. Slight
cervical dextrocurvature. Trace C3-C4 grade 1 anterolisthesis. Trace
C7-T1 grade 1 anterolisthesis.

Vertebrae: Vertebral body height is maintained. No significant
marrow edema or focal suspicious osseous lesion.

Cord: No signal abnormality identified within the cervical spinal
cord.

Posterior Fossa, vertebral arteries, paraspinal tissues: No
abnormality identified within included portions of the posterior
fossa. Left maxillary sinus mucosal thickening. Flow voids preserved
within the imaged cervical vertebral arteries. Paraspinal soft
tissues unremarkable.

Disc levels:

Mild-to-moderate disc degeneration at C5-C6. No more than mild disc
degeneration at the remaining levels.

C2-C3: No significant disc herniation or stenosis.

C3-C4: Trace grade 1 anterolisthesis. Disc uncovering and slight
disc bulge. Uncovertebral hypertrophy on the right. Facet arthrosis
(greater on the right). No significant spinal canal stenosis.
Mild/moderate right neural foraminal narrowing.

C4-C5: Shallow disc bulge. Facet arthrosis. No significant spinal
canal or foraminal stenosis.

C5-C6: Disc bulge asymmetric to the right. Mild uncovertebral
hypertrophy on the right. No significant spinal canal stenosis. Mild
right neural foraminal narrowing.

C6-C7: Slight disc bulge. No significant spinal canal or foraminal
stenosis.

C7-T1: Trace grade 1 anterolisthesis. Facet arthrosis on the left.
No significant spinal canal or foraminal stenosis.
IMPRESSION: Cervical spondylosis, as outlined. No significant spinal canal
stenosis. Neural foraminal narrowing on the right at C3-C4
(mild/moderate) and on the right at C5-C6 (mild).

Disc degeneration is greatest at C5-C6 (mild to moderate at this
level).

Straightening of the expected cervical lordosis.

Slight cervical dextrocurvature, possibly positional.

Trace grade 1 anterolisthesis at C3-C4 and C7-T1.

## 2022-11-09 ENCOUNTER — Encounter: Payer: Self-pay | Admitting: Internal Medicine

## 2022-11-09 ENCOUNTER — Ambulatory Visit (INDEPENDENT_AMBULATORY_CARE_PROVIDER_SITE_OTHER): Payer: Medicare Other | Admitting: Internal Medicine

## 2022-11-09 VITALS — BP 120/60 | HR 78 | Temp 96.3°F | Ht 62.0 in | Wt 156.0 lb

## 2022-11-09 DIAGNOSIS — J0111 Acute recurrent frontal sinusitis: Secondary | ICD-10-CM | POA: Insufficient documentation

## 2022-11-09 MED ORDER — DOXYCYCLINE HYCLATE 100 MG PO TABS: 100.0000 mg | ORAL_TABLET | Freq: Two times a day (BID) | ORAL | 1 refills | Status: DC

## 2022-11-09 NOTE — Assessment & Plan Note (Addendum)
Never quite resolved after the augmentin Will try doxy 100 bid instead (#14 x 1) Analgesics Flonase for now  If not better with the doxy--will retry the augmentin but for 2 weeks

## 2022-11-09 NOTE — Progress Notes (Signed)
Subjective:    Patient ID: Virginia Floyd, female    DOB: 06-15-1955, 67 y.o.   MRN: [id number]  HPI Here due to ongoing respiratory symptoms  Sinus infection treated a month ago Took augmentin and improved--but never completely better Woke today with left eye swelling Also had sore in her nose last week--used saline solution and neosporin. Now improved after 2 weeks  No fever Slight cough Is having drainage and congestion on left Some frontal headache No ear pain  Current Outpatient Medications on File Prior to Visit  Medication Sig Dispense Refill   atorvastatin (LIPITOR) 40 MG tablet Take 1 tablet (40 mg total) by mouth daily. 90 tablet 3   gabapentin (NEURONTIN) 400 MG capsule Take 1 capsule (400 mg total) by mouth at bedtime. 90 capsule 3   glipiZIDE (GLUCOTROL XL) 10 MG 24 hr tablet Take 1 tablet (10 mg total) by mouth daily. 10 tablet 0   levothyroxine (SYNTHROID) 75 MCG tablet Take 1 tablet (75 mcg total) by mouth daily. 30 tablet 0   metFORMIN (GLUCOPHAGE) 1000 MG tablet Take 1 tablet (1,000 mg total) by mouth 2 (two) times daily with a meal. 180 tablet 3   No current facility-administered medications on file prior to visit.    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   OAB (overactive bladder)     Past Surgical History:  Procedure Laterality Date   ABDOMINAL HYSTERECTOMY     CHOLECYSTECTOMY     LASIK     LIPOSUCTION     laser --for abdomen   TRIGGER FINGER RELEASE     multiple    Family History  Problem Relation Age of Onset   Lupus Mother     Alcohol abuse Father    Cancer Father    Heart disease Sister    Diabetes Sister    Hodgkin's lymphoma Brother    Bladder Cancer Brother    Breast cancer Neg Hx     Social History   Socioeconomic History   Marital status: Widowed    Spouse name: Not on file   Number of children: 1   Years of education: Not on file   Highest education level: Master's degree (e.g., MA, MS, MEng, MEd, MSW, MBA)  Occupational History   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    Comment: Retired  Tobacco Use   Smoking status: Former    Current packs/day: 0.00    Average packs/day: 0.8 packs/day for 40.0 years (30.0 ttl pk-yrs)    Types: Cigarettes    Start date: 36    Quit date: 2015    Years since quitting: 9.6    Passive exposure: Past   Smokeless tobacco: Never  Vaping Use   Vaping status: Never Used  Substance and Sexual Activity   Alcohol use: Not Currently   Drug use: Not Currently   Sexual activity: Not on file  Other Topics Concern   Not on file  Social History Narrative   Has living will   Son Virginia Floyd should be Management consultant   Woould accept  resuscitations   Social Determinants of Health   Financial Resource Strain: Low Risk  (06/29/2022)   Overall Financial Resource Strain (CARDIA)    Difficulty of Paying Living Expenses: Not hard at all  Food Insecurity: No Food Insecurity (06/29/2022)   Hunger Vital Sign    Worried About Running Out of Food in the Last Year: Never true    Ran Out of Food in the Last Year: Never true  Transportation Needs: No Transportation Needs (06/29/2022)   PRAPARE - Administrator, Civil Service (Medical): No    Lack of Transportation (Non-Medical): No  Physical Activity: Unknown (06/29/2022)   Exercise Vital Sign    Days of Exercise per Week: 0 days    Minutes of Exercise per Session: Not on file  Stress: Stress Concern Present (06/29/2022)   [employer] of Occupational Health - Occupational Stress Questionnaire     Feeling of Stress : To some extent  Social Connections: Moderately Isolated (06/29/2022)   Social Connection and Isolation Panel [NHANES]    Frequency of Communication with Friends and Family: More than three times a week    Frequency of Social Gatherings with Friends and Family: More than three times a week    Attends Religious Services: Never    Database administrator or Organizations: Yes    Attends Engineer, structural: More than 4 times per year    Marital Status: Widowed  Catering manager Violence: Not on file   Review of Systems No N/V Has some pressure sensation between scapulae No SOB     Objective:   Physical Exam Constitutional:      Appearance: Normal appearance.  HENT:     Head:     Comments: Mild frontal tenderness    Right Ear: Tympanic membrane and ear canal normal.     Left Ear: Tympanic membrane and ear canal normal.     Mouth/Throat:     Pharynx: No oropharyngeal exudate or posterior oropharyngeal erythema.  Neck:     Comments: Mild tenderness in anterior cervical area Pulmonary:     Effort: Pulmonary effort is normal.     Breath sounds: Normal breath sounds. No wheezing or rales.  Neurological:     Mental Status: She is alert.            Assessment & Plan:

## 2022-11-26 ENCOUNTER — Encounter: Payer: Self-pay | Admitting: Internal Medicine

## 2022-11-26 MED ORDER — AMOXICILLIN-POT CLAVULANATE 875-125 MG PO TABS: 1.0000 | ORAL_TABLET | Freq: Two times a day (BID) | ORAL | 0 refills | Status: DC

## 2023-01-21 ENCOUNTER — Encounter: Payer: Self-pay | Admitting: Internal Medicine

## 2023-01-21 DIAGNOSIS — N390 Urinary tract infection, site not specified: Secondary | ICD-10-CM

## 2023-02-04 ENCOUNTER — Ambulatory Visit: Payer: Self-pay | Admitting: Urology

## 2023-02-04 VITALS — BP 154/72 | HR 75 | Ht 62.0 in | Wt 161.0 lb

## 2023-02-04 DIAGNOSIS — Z87440 Personal history of urinary (tract) infections: Secondary | ICD-10-CM

## 2023-02-04 DIAGNOSIS — E119 Type 2 diabetes mellitus without complications: Secondary | ICD-10-CM

## 2023-02-04 DIAGNOSIS — N390 Urinary tract infection, site not specified: Secondary | ICD-10-CM

## 2023-02-04 LAB — MICROSCOPIC EXAMINATION
Epithelial Cells (non renal): >10 /[HPF] — AB (ref 0–10)
RBC, Urine: NONE SEEN /[HPF] (ref 0–2)

## 2023-02-04 LAB — URINALYSIS, COMPLETE
Bilirubin, UA: NEGATIVE
Leukocytes,UA: NEGATIVE
Nitrite, UA: NEGATIVE
Protein,UA: NEGATIVE
RBC, UA: NEGATIVE
Specific Gravity, UA: 1.025 (ref 1.005–1.030)
Urobilinogen, Ur: 0.2 mg/dL (ref 0.2–1.0)
pH, UA: 5.5 (ref 5.0–7.5)

## 2023-02-04 LAB — BLADDER SCAN AMB NON-IMAGING: Scan Result: 32

## 2023-02-04 MED ORDER — ESTRADIOL 0.1 MG/GM VA CREA: TOPICAL_CREAM | VAGINAL | 3 refills | Status: DC

## 2023-02-04 NOTE — Patient Instructions (Signed)
For UTI prevention take cranberry tablets, probiotic, D-Mannose daily.  

## 2023-02-08 MED ORDER — ESTRADIOL 0.1 MG/GM VA CREA: TOPICAL_CREAM | VAGINAL | 3 refills | Status: DC

## 2023-02-08 NOTE — Addendum Note (Signed)
Addended by: Consuella Lose on: 02/08/2023 10:24 AM   Modules accepted: Orders

## 2023-02-09 ENCOUNTER — Ambulatory Visit: Payer: Self-pay | Admitting: Urology

## 2023-02-15 ENCOUNTER — Other Ambulatory Visit: Payer: Self-pay | Admitting: Internal Medicine

## 2023-02-17 ENCOUNTER — Ambulatory Visit
Admission: RE | Admit: 2023-02-17 | Discharge: 2023-02-17 | Disposition: A | Discharge status: Home / Self Care | Payer: Medicare Other | Source: Ambulatory Visit | Attending: Urology | Admitting: Urology

## 2023-02-17 DIAGNOSIS — N390 Urinary tract infection, site not specified: Secondary | ICD-10-CM | POA: Diagnosis present

## 2023-02-17 DIAGNOSIS — E119 Type 2 diabetes mellitus without complications: Secondary | ICD-10-CM | POA: Insufficient documentation

## 2023-02-27 ENCOUNTER — Other Ambulatory Visit: Payer: Self-pay | Admitting: Internal Medicine

## 2023-02-27 DIAGNOSIS — Z1231 Encounter for screening mammogram for malignant neoplasm of breast: Secondary | ICD-10-CM

## 2023-03-26 ENCOUNTER — Encounter: Payer: Self-pay | Admitting: Internal Medicine

## 2023-03-29 ENCOUNTER — Ambulatory Visit
Admission: RE | Admit: 2023-03-29 | Discharge: 2023-03-29 | Disposition: A | Discharge status: Home / Self Care | Payer: Medicare Other | Source: Ambulatory Visit

## 2023-03-29 DIAGNOSIS — Z1231 Encounter for screening mammogram for malignant neoplasm of breast: Secondary | ICD-10-CM

## 2023-03-29 MED ORDER — DULOXETINE HCL 30 MG PO CPEP: 30.0000 mg | ORAL_CAPSULE | Freq: Every day | ORAL | 3 refills | Status: DC

## 2023-03-29 NOTE — Addendum Note (Signed)
Addended by: Eual Fines on: 03/29/2023 08:18 AM   Modules accepted: Orders

## 2023-04-21 ENCOUNTER — Other Ambulatory Visit: Payer: Self-pay | Admitting: Internal Medicine

## 2023-04-21 MED ORDER — GLIPIZIDE ER 10 MG PO TB24: 10.0000 mg | ORAL_TABLET | Freq: Every day | ORAL | 1 refills | Status: DC

## 2023-04-21 NOTE — Telephone Encounter (Signed)
 Copied from CRM [id number]. Topic: Clinical - Medication Refill >> Apr 21, 2023  1:04 PM Roseanne Cones wrote: Most Recent Primary Care Visit:  Provider: Curt Dover I  Department: [facility]  Visit Type: OFFICE VISIT  Date: 11/09/2022  Medication: glipiZIDE  (GLUCOTROL  XL) 10 MG 24 hr tablet  Has the patient contacted their pharmacy? No, patient normally gets it through the mail order, however, asking for it to be sent to [facility] this time. (Agent: If no, request that the patient contact the pharmacy for the refill. If patient does not wish to contact the pharmacy document the reason why and proceed with request.) (Agent: If yes, when and what did the pharmacy advise?)  Is this the correct pharmacy for this prescription? No If no, delete pharmacy and type the correct one.   [id number] [place], [place] - [address] [place] [address] Phone: [phone] Fax: [phone]   Has the prescription been filled recently? No  Is the patient out of the medication? No  Has the patient been seen for an appointment in the last year OR does the patient have an upcoming appointment? Yes  Can we respond through MyChart? Yes  Agent: Please be advised that Rx refills may take up to 3 business days. We ask that you follow-up with your pharmacy.

## 2023-04-22 LAB — HM DIABETES EYE EXAM

## 2023-04-27 ENCOUNTER — Other Ambulatory Visit: Payer: Self-pay | Admitting: *Deleted

## 2023-04-27 ENCOUNTER — Telehealth: Payer: Self-pay | Admitting: *Deleted

## 2023-04-27 DIAGNOSIS — Z87891 Personal history of nicotine dependence: Secondary | ICD-10-CM

## 2023-04-27 DIAGNOSIS — Z122 Encounter for screening for malignant neoplasm of respiratory organs: Secondary | ICD-10-CM

## 2023-04-27 NOTE — Telephone Encounter (Signed)
Lung Cancer Screening Narrative/Criteria Questionnaire (Cigarette Smokers Only- No Cigars/Pipes/vapes)   Virginia Floyd   SDMV:05/05/23 9:30  Virginia Floyd   01/07/1956              LDCT: 05/06/23 10:00 Opic    68 y.o.   Phone: [phone]  Lung Screening Narrative (confirm age 33-77 yrs Medicare / 50-80 yrs Private pay insurance)   Insurance information:[employer]ARP   Referring Provider:Dgayli   This screening involves an initial phone call with a team member from our program. It is called a shared decision making visit. The initial meeting is required by  insurance and Medicare to make sure you understand the program. This appointment takes about 15-20 minutes to complete. You will complete the screening scan at your scheduled date/time.  This scan takes about 5-10 minutes to complete. You can eat and drink normally before and after the scan.  Criteria questions for Lung Cancer Screening:   Are you a current or former smoker? Former Age began smoking: 14   If you are a former smoker, what year did you quit smoking? 2016 (within 15 yrs)   To calculate your smoking history, I need an accurate estimate of how many packs of cigarettes you smoked per day and for how many years. (Not just the number of PPD you are now smoking)   Years smoking 44 x Packs per day 1 = Pack years 42   (at least 20 pack yrs)   (Make sure they understand that we need to know how much they have smoked in the past, not just the number of PPD they are smoking now)  Do you have a personal history of cancer?  Yes - (type and when diagnosed - 5 yrs cancer free) Lymphoma 2009    Do you have a family history of cancer? Yes  (cancer type and and relative) Father "all over"  Brother- Bladder and lymphoma  Are you coughing up blood?  No  Have you had unexplained weight loss of 15 lbs or more in the last 6 months? No  It looks like you meet all criteria.  When would be a good time for [place] to schedule you for this  screening?   Additional information: N/A

## 2023-05-05 ENCOUNTER — Ambulatory Visit (INDEPENDENT_AMBULATORY_CARE_PROVIDER_SITE_OTHER): Payer: Medicare Other | Admitting: Acute Care

## 2023-05-05 DIAGNOSIS — Z87891 Personal history of nicotine dependence: Secondary | ICD-10-CM

## 2023-05-05 NOTE — Patient Instructions (Signed)

## 2023-05-05 NOTE — Progress Notes (Addendum)
 Provider Attestation I agree with the documentation of the Shared Decision Making visit,  smoking cessation counseling if appropriate, and verification or eligibility for lung cancer screening as documented by the RN Nurse Navigator.   Raejean Bullock, MSN, AGACNP-BC [facility] See Amion for personal pager PCCM on call pager [phone]    Virtual Visit via Telephone Note  I connected with Virginia Floyd on 05/05/23 at  9:30 AM EST by telephone and verified that I am speaking with the correct person using two identifiers.  Location: Patient: Virginia Floyd Provider: Alyse Bach, RN   I discussed the limitations, risks, security and privacy concerns of performing an evaluation and management service by telephone and the availability of in person appointments. I also discussed with the patient that there may be a patient responsible charge related to this service. The patient expressed understanding and agreed to proceed.  Shared Decision Making Visit Lung Cancer Screening Program [id number])   Eligibility: Age 68 y.o. Pack Years Smoking History Calculation 43 (# packs/per year x # years smoked) Recent History of coughing up blood  no Unexplained weight loss? no ( >Than 15 pounds within the last 6 months ) Prior History Lung / other cancer no (Diagnosis within the last 5 years already requiring surveillance chest CT Scans). Smoking Status Former Smoker Former Smokers: Years since quit: 8 years  Quit Date: 2016  Visit Components: Discussion included one or more decision making aids. yes Discussion included risk/benefits of screening. yes Discussion included potential follow up diagnostic testing for abnormal scans. yes Discussion included meaning and risk of over diagnosis. yes Discussion included meaning and risk of False Positives. yes Discussion included meaning of total radiation exposure. yes  Counseling Included: Importance of adherence to  annual lung cancer LDCT screening. yes Impact of comorbidities on ability to participate in the program. yes Ability and willingness to under diagnostic treatment. yes  Smoking Cessation Counseling: Current Smokers:  Discussed importance of smoking cessation. yes Information about tobacco cessation classes and interventions provided to patient. yes Patient provided with "ticket" for LDCT Scan. no Symptomatic Patient. no  Counseling(Intermediate counseling: > three minutes) 99406 Diagnosis Code: Tobacco Use Z72.0 Asymptomatic Patient yes  Counseling (Intermediate counseling: > three minutes counseling) B1478 Former Smokers:  Discussed the importance of maintaining cigarette abstinence. yes Diagnosis Code: Personal History of Nicotine Dependence. G95.621 Information about tobacco cessation classes and interventions provided to patient. Yes Patient provided with "ticket" for LDCT Scan. no Written Order for Lung Cancer Screening with LDCT placed in Epic. Yes (CT Chest Lung Cancer Screening Low Dose W/O CM) [id number] Z12.2-Screening of respiratory organs Z87.891-Personal history of nicotine dependence   Alyse Bach, RN

## 2023-05-06 ENCOUNTER — Ambulatory Visit
Admission: RE | Admit: 2023-05-06 | Discharge: 2023-05-06 | Disposition: A | Discharge status: Home / Self Care | Payer: Medicare Other | Source: Ambulatory Visit | Attending: Acute Care | Admitting: Acute Care

## 2023-05-06 DIAGNOSIS — Z122 Encounter for screening for malignant neoplasm of respiratory organs: Secondary | ICD-10-CM | POA: Diagnosis present

## 2023-05-06 DIAGNOSIS — Z87891 Personal history of nicotine dependence: Secondary | ICD-10-CM | POA: Insufficient documentation

## 2023-05-10 ENCOUNTER — Telehealth: Payer: Self-pay | Admitting: Internal Medicine

## 2023-05-10 MED ORDER — GLIPIZIDE ER 10 MG PO TB24: 10.0000 mg | ORAL_TABLET | Freq: Every day | ORAL | 3 refills | Status: DC

## 2023-05-10 NOTE — Addendum Note (Signed)
Addended by: Eual Fines on: 05/10/2023 04:30 PM   Modules accepted: Orders

## 2023-05-10 NOTE — Telephone Encounter (Signed)
This call was not from the pt. It was from the pharmacy. New rx sent to pharmacy.

## 2023-05-10 NOTE — Telephone Encounter (Signed)
Copied from CRM [id number]. Topic: General - Other >> May 10, 2023 11:17 AM Melissa C wrote: Reason for CRM: Call asking to see if fax was received for authorization for glipiZIDE (GLUCOTROL XL) 10 MG 24 hr tablet callback number is [phone]  fax number is [phone]. Thank you

## 2023-05-16 ENCOUNTER — Emergency Department: Payer: Medicare Other

## 2023-05-16 ENCOUNTER — Emergency Department
Admission: EM | Admit: 2023-05-16 | Discharge: 2023-05-17 | Disposition: A | Discharge status: Home / Self Care | Payer: Medicare Other | Attending: Emergency Medicine | Admitting: Emergency Medicine

## 2023-05-16 ENCOUNTER — Other Ambulatory Visit: Payer: Self-pay

## 2023-05-16 DIAGNOSIS — I2510 Atherosclerotic heart disease of native coronary artery without angina pectoris: Secondary | ICD-10-CM

## 2023-05-16 DIAGNOSIS — R5383 Other fatigue: Secondary | ICD-10-CM

## 2023-05-16 DIAGNOSIS — E119 Type 2 diabetes mellitus without complications: Secondary | ICD-10-CM | POA: Diagnosis not present

## 2023-05-16 DIAGNOSIS — R0602 Shortness of breath: Secondary | ICD-10-CM

## 2023-05-16 DIAGNOSIS — Z87891 Personal history of nicotine dependence: Secondary | ICD-10-CM | POA: Insufficient documentation

## 2023-05-16 DIAGNOSIS — I10 Essential (primary) hypertension: Secondary | ICD-10-CM | POA: Insufficient documentation

## 2023-05-16 LAB — BASIC METABOLIC PANEL
Anion gap: 13 (ref 5–15)
BUN: 15 mg/dL (ref 8–23)
CO2: 23 mmol/L (ref 22–32)
Calcium: 9.1 mg/dL (ref 8.9–10.3)
Chloride: 102 mmol/L (ref 98–111)
Creatinine, Ser: 0.71 mg/dL (ref 0.44–1.00)
GFR, Estimated: >60 mL/min (ref >60)
Glucose, Bld: 231 mg/dL — ABNORMAL HIGH (ref 70–99)
Potassium: 3.6 mmol/L (ref 3.5–5.1)
Sodium: 138 mmol/L (ref 135–145)

## 2023-05-16 LAB — CBC
HCT: 43.9 % (ref 36.0–46.0)
Hemoglobin: 14.7 g/dL (ref 12.0–15.0)
MCH: 29.2 pg (ref 26.0–34.0)
MCHC: 33.5 g/dL (ref 30.0–36.0)
MCV: 87.3 fL (ref 80.0–100.0)
Platelets: 208 10*3/uL (ref 150–400)
RBC: 5.03 MIL/uL (ref 3.87–5.11)
RDW: 13.2 % (ref 11.5–15.5)
WBC: 10.3 10*3/uL (ref 4.0–10.5)
nRBC: 0 % (ref 0.0–0.2)

## 2023-05-16 LAB — TROPONIN I (HIGH SENSITIVITY): Troponin I (High Sensitivity): 5 ng/L (ref <18)

## 2023-05-16 NOTE — ED Triage Notes (Signed)
 Pt to ED via POV c/o SHOB and left shoulder pain x10 days. Pt had CT done on Jan 30th and got results back. Pt saw on my chart that results said "3 artery atherosclerosis" and wanted to come in to get checked. Denies CP, fevers, dizziness

## 2023-05-17 ENCOUNTER — Other Ambulatory Visit: Payer: Self-pay

## 2023-05-17 DIAGNOSIS — Z122 Encounter for screening for malignant neoplasm of respiratory organs: Secondary | ICD-10-CM

## 2023-05-17 DIAGNOSIS — Z87891 Personal history of nicotine dependence: Secondary | ICD-10-CM

## 2023-05-17 DIAGNOSIS — F17210 Nicotine dependence, cigarettes, uncomplicated: Secondary | ICD-10-CM

## 2023-05-17 LAB — TROPONIN I (HIGH SENSITIVITY): Troponin I (High Sensitivity): 4 ng/L (ref <18)

## 2023-05-17 NOTE — Discharge Instructions (Addendum)
 Thank you for choosing [place] for your health care today!  Please see your primary doctor this week for a follow up appointment.   If you have any new, worsening, or unexpected symptoms call your doctor right away or come back to the emergency department for reevaluation.  It was my pleasure to care for you today.   Daneil Dan [place] Charon, MD

## 2023-05-17 NOTE — ED Provider Notes (Signed)
 [facility] Provider Note    Event Date/Time   First MD Initiated Contact with Patient 05/17/23 [phone]     (approximate)   History   Shortness of Breath   HPI  Virginia Floyd is a 68 y.o. female   Past medical history of previous smoker, diabetes, hyperlipidemia, who presents emerged apartment with concerns from a CT scan that she obtained for routine lung screening at the end of last month.  She got the results that showed heart disease which she is concerned about.  Otherwise she is more or less in her regular state of health, does see a pulmonologist for chronic shortness of breath, has been feeling more fatigued over the last several weeks but denies chest pain.  She has no other acute medical complaints.  She specifically denies history of blood clots, leg swelling, pain, or respiratory infectious symptoms.  She is especially concerned because her sister died of heart disease.   External Medical Documents Reviewed: CT scan of the chest obtained 05/06/2023 showing cardiac vessel disease      Physical Exam   Triage Vital Signs: ED Triage Vitals  Encounter Vitals Group     BP 05/16/23 2142 (!) 183/70     Systolic BP Percentile --      Diastolic BP Percentile --      Pulse Rate 05/16/23 2142 90     Resp 05/16/23 2142 18     Temp 05/16/23 2142 97.7 F (36.5 C)     Temp Source 05/16/23 2142 Oral     SpO2 05/16/23 2142 95 %     Weight 05/16/23 2140 160 lb (72.6 kg)     Height 05/16/23 2140 5\' 2"  (1.575 m)     Head Circumference --      Peak Flow --      Pain Score 05/16/23 2139 3     Pain Loc --      Pain Education --      Exclude from Growth Chart --     Most recent vital signs: Vitals:   05/17/23 0149 05/17/23 0157  BP:    Pulse:    Resp:    Temp: 97.8 F (36.6 C) 97.8 F (36.6 C)  SpO2:      General: Awake, no distress.  CV:  Good peripheral perfusion.  Resp:  Normal effort.  Abd:  No distention.  Other:  Pleasant woman with  hypertension otherwise vital signs normal.  Clear lungs without focality or wheezing.  Heart sounds normal rate and rhythm without murmurs.  She appears euvolemic overall.  She is soft benign abdominal exam.   ED Results / Procedures / Treatments   Labs (all labs ordered are listed, but only abnormal results are displayed) Labs Reviewed  BASIC METABOLIC PANEL - Abnormal; Notable for the following components:      Result Value   Glucose, Bld 231 (*)    All other components within normal limits  CBC  TROPONIN I (HIGH SENSITIVITY)  TROPONIN I (HIGH SENSITIVITY)     I ordered and reviewed the above labs they are notable for her glucose is high the 200s otherwise cell counts electrolytes and serial troponins negative.  EKG  ED ECG REPORT I, Buell Carmin, the attending physician, personally viewed and interpreted this ECG.   Date: 05/17/2023  EKG Time: 2144  Rate: 86  Rhythm: sinus  Axis: nl  Intervals:none  ST&T Change: no stemi    RADIOLOGY I independently reviewed and interpreted chest x-ray  and I see no obvious focality pneumothorax I also reviewed radiologist's formal read.   PROCEDURES:  Critical Care performed: No  Procedures   MEDICATIONS ORDERED IN ED: Medications - No data to display  IMPRESSION / MDM / ASSESSMENT AND PLAN / ED COURSE  I reviewed the triage vital signs and the nursing notes.                                Patient's presentation is most consistent with acute presentation with potential threat to life or bodily function.  Differential diagnosis includes, but is not limited to, ACS, dissection, PE, cardiac arthrosclerosis   The patient is on the cardiac monitor to evaluate for evidence of arrhythmia and/or significant heart rate changes.  MDM:   This patient with a chief complaint concerned about her coronary artery disease found in her routine lung screening.  She has no other acute symptoms suggestive of ACS though she does have  cardiac risk factors.  There are some subacute worsening of her chronic shortness of breath and overall fatigue for which she follows up with PMD and pulmonologist already.  Currently she has no chest pain or marked shortness of breath and so I doubt cardiopulmonary emergency at this time like ACS PE or dissection.  EKG is nonischemic and serial troponins been flat.  I think she should follow-up with her PMD and pulmonologist for ongoing issues and given her cardiac risk factors, cardiac disease on imaging, and family history of cardiac disease, will make referral to cardiologist for her as outpatient follow-up.       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   05/17/23 0213             Note:  This document was prepared using Dragon voice recognition software and may include unintentional dictation errors.    Buell Carmin, MD 05/17/23 [phone]

## 2023-05-18 ENCOUNTER — Encounter: Payer: Self-pay | Admitting: Internal Medicine

## 2023-05-18 ENCOUNTER — Ambulatory Visit (INDEPENDENT_AMBULATORY_CARE_PROVIDER_SITE_OTHER): Payer: Medicare Other | Admitting: Internal Medicine

## 2023-05-18 VITALS — BP 136/78 | HR 84 | Temp 98.6°F | Ht 62.0 in | Wt 166.0 lb

## 2023-05-18 DIAGNOSIS — I2510 Atherosclerotic heart disease of native coronary artery without angina pectoris: Secondary | ICD-10-CM | POA: Diagnosis not present

## 2023-05-18 NOTE — Assessment & Plan Note (Addendum)
Discussed that the result is qualitative and doesn't give an idea of the extent of blockage Will probably need a gated study--or other testing to quantify the risk Did have benign heart cath in 2017---had just one vessel disease with 20% blockage Is on the atorvastatin

## 2023-05-18 NOTE — Progress Notes (Signed)
Subjective:    Patient ID: Virginia Floyd, female    DOB: Mar 17, 1956, 68 y.o.   MRN: [id number]  HPI Here to review abnormal CT scan  Got email about CT scan done for cancer screening Read on MyChart and was concerned about the 3 vessel coronary Panicked and went to ER--no action taken  Has noticed some fatigue lately Some increase in SOB No chest pain  Really worried because sister died of MI at 74--noted to have CAD  Current Outpatient Medications on File Prior to Visit  Medication Sig Dispense Refill   atorvastatin (LIPITOR) 40 MG tablet TAKE 1 TABLET (40 MG TOTAL) BY MOUTH DAILY. 90 tablet 3   DULoxetine (CYMBALTA) 30 MG capsule Take 1 capsule (30 mg total) by mouth daily. 90 capsule 3   gabapentin (NEURONTIN) 400 MG capsule Take 1 capsule (400 mg total) by mouth at bedtime. 90 capsule 3   glipiZIDE (GLUCOTROL XL) 10 MG 24 hr tablet Take 1 tablet (10 mg total) by mouth daily. 90 tablet 3   levothyroxine (SYNTHROID) 75 MCG tablet TAKE 1 TABLET (75 MCG TOTAL) BY MOUTH DAILY. 90 tablet 3   metFORMIN (GLUCOPHAGE) 1000 MG tablet Take 1 tablet (1,000 mg total) by mouth 2 (two) times daily with a meal. 180 tablet 3   No current facility-administered medications on file prior to visit.    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   OAB (overactive bladder)     Past Surgical History:  Procedure Laterality Date   ABDOMINAL HYSTERECTOMY     CHOLECYSTECTOMY     LASIK     LIPOSUCTION     laser --for abdomen   TRIGGER FINGER RELEASE     multiple    Family History   Problem Relation Age of Onset   Lupus Mother    Alcohol abuse Father    Cancer Father    Heart disease Sister    Diabetes Sister    Hodgkin's lymphoma Brother    Bladder Cancer Brother    Breast cancer Neg Hx     Social History   Socioeconomic History   Marital status: Widowed    Spouse name: Not on file   Number of children: 1   Years of education: Not on file   Highest education level: Master's degree (e.g., MA, MS, MEng, MEd, MSW, MBA)  Occupational History   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    Comment: Retired  Tobacco Use   Smoking status: Former    Current packs/day: 0.00    Average packs/day: 0.8 packs/day for 43.0 years (32.3 ttl pk-yrs)    Types: Cigarettes    Start date: 9    Quit date: 2015    Years since quitting: 10.1    Passive exposure: Past   Smokeless tobacco: Never  Vaping Use   Vaping status: Never Used  Substance and Sexual Activity   Alcohol use: Not Currently   Drug use: Not Currently   Sexual activity: Not on file  Other Topics Concern   Not on file  Social History  Narrative   Has living will   Son Wynelle Cleveland should be decision maker   Woould accept resuscitations   Social Drivers of Health   Financial Resource Strain: Low Risk  (06/29/2022)   Overall Financial Resource Strain (CARDIA)    Difficulty of Paying Living Expenses: Not hard at all  Food Insecurity: No Food Insecurity (06/29/2022)   Hunger Vital Sign    Worried About Running Out of Food in the Last Year: Never true    Ran Out of Food in the Last Year: Never true  Transportation Needs: No Transportation Needs (06/29/2022)   PRAPARE - Administrator, Civil Service (Medical): No    Lack of Transportation (Non-Medical): No  Physical Activity: Unknown (06/29/2022)   Exercise Vital Sign    Days of Exercise per Week: 0 days    Minutes of Exercise per Session: Not on file  Stress: Stress Concern Present (06/29/2022)   [employer] of Occupational  Health - Occupational Stress Questionnaire    Feeling of Stress : To some extent  Social Connections: Moderately Isolated (06/29/2022)   Social Connection and Isolation Panel [NHANES]    Frequency of Communication with Friends and Family: More than three times a week    Frequency of Social Gatherings with Friends and Family: More than three times a week    Attends Religious Services: Never    Database administrator or Organizations: Yes    Attends Engineer, structural: More than 4 times per year    Marital Status: Widowed  Intimate Partner Violence: Not on file   Review of Systems     Objective:   Physical Exam Constitutional:      Appearance: Normal appearance.  Neurological:     Mental Status: She is alert.            Assessment & Plan:

## 2023-06-02 ENCOUNTER — Encounter: Payer: Self-pay | Admitting: Cardiovascular Disease

## 2023-06-02 ENCOUNTER — Ambulatory Visit: Payer: Medicare Other | Attending: Cardiovascular Disease | Admitting: Cardiovascular Disease

## 2023-06-02 VITALS — BP 142/62 | HR 81 | Ht 62.0 in | Wt 163.8 lb

## 2023-06-02 DIAGNOSIS — E785 Hyperlipidemia, unspecified: Secondary | ICD-10-CM | POA: Diagnosis present

## 2023-06-02 DIAGNOSIS — R0602 Shortness of breath: Secondary | ICD-10-CM | POA: Insufficient documentation

## 2023-06-02 DIAGNOSIS — R072 Precordial pain: Secondary | ICD-10-CM | POA: Diagnosis present

## 2023-06-02 DIAGNOSIS — Z01812 Encounter for preprocedural laboratory examination: Secondary | ICD-10-CM | POA: Diagnosis present

## 2023-06-02 MED ORDER — METOPROLOL TARTRATE 100 MG PO TABS: 100.0000 mg | ORAL_TABLET | Freq: Once | ORAL | 0 refills | Status: DC

## 2023-06-02 NOTE — Patient Instructions (Addendum)
 Medication Instructions:  TAKE 1 tablet Metoprolol Tartrate (LOPRESSOR) 100MG  two hours before cardiac testing.    *If you need a refill on your cardiac medications before your next appointment, please call your pharmacy*   Lab Work: Basic Metabolic Panel  Lipid Panel : Having drawn at PCP   If you have labs (blood work) drawn today and your tests are completely normal, you will receive your results only by: MyChart Message (if you have MyChart) OR A paper copy in the mail If you have any lab test that is abnormal or we need to change your treatment, we will call you to review the results.   Testing/Procedures:   Your cardiac CT will be scheduled at one of the below locations:   [facility] [address] [place], [place] [address] [phone]    If scheduled at [facility], please arrive at the [facility] and Children's Entrance (Entrance C2) of [facility] 30 minutes prior to test start time. You can use the FREE valet parking offered at entrance C (encouraged to control the heart rate for the test)  Proceed to the [facility] Radiology Department (first floor) to check-in and test prep.  All radiology patients and guests should use entrance C2 at [facility], accessed from [facility], even though the hospital's physical address listed is [address]    If scheduled at [facility] or [facility], please arrive 15 mins early for check-in and test prep.  There is spacious parking and easy access to the radiology department from the [facility] entrance. Please enter here and check-in with the desk attendant.   If scheduled at [facility], please arrive 30 minutes early for check-in and test prep.  Please follow these instructions carefully (unless otherwise directed):  An IV will be required for this test and Nitroglycerin will be given.     On the Night Before the Test: Be sure to Drink plenty of water. Do not consume any caffeinated/decaffeinated beverages or chocolate 12 hours prior to your test. Do not take any antihistamines 12 hours prior to your test.   On the Day of the Test: Drink plenty of water until 1 hour prior to the test. Do not eat any food 1 hour prior to test. You may take your regular medications prior to the test.  Take metoprolol (Lopressor) two hours prior to test. If you take Furosemide/Hydrochlorothiazide/Spironolactone/Chlorthalidone, please HOLD on the morning of the test. Patients who wear a continuous glucose monitor MUST remove the device prior to scanning. FEMALES- please wear underwire-free bra if available, avoid dresses & tight clothing HOLD metFORMIN (GLUCOPHAGE) 1000 MG tablet Morning of test       After the Test: Drink plenty of water. After receiving IV contrast, you may experience a mild flushed feeling. This is normal. On occasion, you may experience a mild rash up to 24 hours after the test. This is not dangerous. If this occurs, you can take Benadryl 25 mg, Zyrtec, Claritin, or Allegra and increase your fluid intake. (Patients taking Tikosyn should avoid Benadryl, and may take Zyrtec, Claritin, or Allegra) If you experience trouble breathing, this can be serious. If it is severe call 911 IMMEDIATELY. If it is mild, please call our office.  We will call to schedule your test 2-4 weeks out understanding that some insurance companies will need an authorization prior to the service being performed.   For more information  and frequently asked questions, please visit our website : [email or web address]  For non-scheduling related questions, please contact the cardiac imaging nurse navigator should you have any questions/concerns: Cardiac Imaging Nurse Navigators Direct Office Dial: [phone]   For scheduling needs, including cancellations and rescheduling, please call  [place], [phone].    Follow-Up: At [facility], you and your health needs are our priority.  As part of our continuing mission to provide you with exceptional heart care, we have created designated Provider Care Teams.  These Care Teams include your primary Cardiologist (physician) and Advanced Practice Providers (APPs -  Physician Assistants and Nurse Practitioners) who all work together to provide you with the care you need, when you need it.  We recommend signing up for the patient portal called "MyChart".  Sign up information is provided on this After Visit Summary.  MyChart is used to connect with patients for Virtual Visits (Telemedicine).  Patients are able to view lab/test results, encounter notes, upcoming appointments, etc.  Non-urgent messages can be sent to your provider as well.   To learn more about what you can do with MyChart, go to [email or web address].    Your next appointment:   3 month(s)  The format for your next appointment:   In Person  Provider:   Dr. Royann Shivers    Other Instructions

## 2023-06-03 ENCOUNTER — Encounter: Payer: Self-pay | Admitting: Cardiovascular Disease

## 2023-06-03 NOTE — Progress Notes (Signed)
 Cardiology Office Note:    Date:  06/03/2023   ID:  Virginia Floyd, DOB [birth year], MRN [id number]  PCP:  Karie Schwalbe, MD   [facility] Providers Cardiologist:  None     Referring MD: Karie Schwalbe, MD   Chief Complaint  Patient presents with   Consult  Virginia Floyd is a 68 y.o. female who is being seen today for the evaluation of coronary artery calcifications at the request of Karie Schwalbe, MD.   History of Present Illness:    [name] is a 68 y.o. female with a hx of Hodgkin's lymphoma (lymphocyte predominance, treated with Rituxan only in 2008 without recurrence), treated hypothyroidism, type 2 diabetes mellitus managed with oral antidiabetics, hypercholesterolemia on statin, noted to have coronary calcification and aortic atherosclerosis on a chest CT study performed for screening for lung cancer.  Predominant complaint is exertional fatigue.  Over the last 3 months she has had occasional and inconsistent complaints of chest pressure with activity, but most commonly just dyspnea.  She does not have chest discomfort at rest, orthopnea, PND, lower extremity edema, claudication, palpitations, syncope.  In 2017 at [facility] she underwent stress echo due to complaints of chest discomfort.  Although the echocardiographic images were negative for ischemia, she did have ECG changes and this led to a coronary angiogram in 2017 that showed just an isolated 20% stenosis in the mid left cervical mass coronary artery.  Left ventricular systolic function was normal and LVEDP was normal at 10 mmHg.  The echocardiographic study did mention aortic valve sclerosis without stenosis and mild mitral insufficiency.  The chest CT performed in February 2025 shows "three-vessel coronary atherosclerosis" and aortic atherosclerosis.  Similar abnormalities were also seen in 2023 and described as "mild"  She has an older sister who died of a myocardial infarction at age 53.  On current  medications glucose control is fair with a hemoglobin A1c of 6.8%.  Lipid profile from November 2023 showed low HDL at 40, mildly elevated triglycerides at 282, direct LDL 106.  She quit smoking in 2017 but her chest CT does show evidence of emphysema.  She did not undergo chest radiation therapy for her lymphoma.  Past Medical History:  Diagnosis Date   Cervical radiculopathy    Diabetes mellitus without complication (HCC)    Hodgkin's lymphoma (HCC) 2008   did well with rituxan   Hyperlipidemia, unspecified hyperlipidemia type    Hypothyroidism    Mood disorder (HCC)    mixed anxiety and depression   OAB (overactive bladder)     Past Surgical History:  Procedure Laterality Date   ABDOMINAL HYSTERECTOMY     CHOLECYSTECTOMY     LASIK     LIPOSUCTION     laser --for abdomen   TRIGGER FINGER RELEASE     multiple    Current Medications: Current Meds  Medication Sig   atorvastatin (LIPITOR) 40 MG tablet TAKE 1 TABLET (40 MG TOTAL) BY MOUTH DAILY.   DULoxetine (CYMBALTA) 30 MG capsule Take 1 capsule (30 mg total) by mouth daily.   gabapentin (NEURONTIN) 400 MG capsule Take 1 capsule (400 mg total) by mouth at bedtime.   glipiZIDE (GLUCOTROL XL) 10 MG 24 hr tablet Take 1 tablet (10 mg total) by mouth daily.   levothyroxine (SYNTHROID) 75 MCG tablet TAKE 1 TABLET (75 MCG TOTAL) BY MOUTH DAILY.   metFORMIN (GLUCOPHAGE) 1000 MG tablet Take 1 tablet (1,000 mg total) by mouth 2 (two) times daily with a  meal.   metoprolol tartrate (LOPRESSOR) 100 MG tablet Take 1 tablet (100 mg total) by mouth once for 1 dose. 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    Comment: Retired  Tobacco Use   Smoking status: Former    Current packs/day: 0.00    Average packs/day: 0.8 packs/day for 43.0 years (32.3 ttl pk-yrs)    Types: Cigarettes    Start date: 9    Quit date: 2015    Years since quitting: 10.1    Passive exposure: Past   Smokeless tobacco: Never  Vaping Use   Vaping status: Never Used  Substance and Sexual Activity   Alcohol use: Not Currently   Drug use: Not Currently   Sexual activity: Not on file  Other Topics Concern   Not on file  Social History Narrative   Has living will   Son Wynelle Cleveland should be decision maker   Woould accept resuscitations   Social Drivers of Health   Financial Resource Strain: Low Risk  (06/29/2022)   Overall Financial Resource Strain (CARDIA)    Difficulty of Paying Living Expenses: Not hard at all  Food Insecurity: No Food Insecurity (06/29/2022)   Hunger Vital Sign    Worried About Running Out of Food in the Last Year: Never true    Ran Out of Food in the Last Year: Never true  Transportation Needs: No Transportation Needs (06/29/2022)   PRAPARE - Administrator, Civil Service (Medical): No    Lack of Transportation (Non-Medical): No  Physical Activity: Unknown (06/29/2022)   Exercise Vital Sign    Days of Exercise per Week: 0 days    Minutes of Exercise per Session: Not on file  Stress: Stress Concern Present (06/29/2022)   [employer] of Occupational Health - Occupational Stress Questionnaire    Feeling of Stress : To some extent  Social Connections: Moderately Isolated (06/29/2022)   Social Connection and Isolation Panel [NHANES]    Frequency of Communication with Friends and Family: More than three times a week    Frequency of Social Gatherings with Friends and Family: More than three times a week    Attends Religious Services: Never    Database administrator or Organizations: Yes    Attends Engineer, structural: More than 4 times per  year    Marital Status: Widowed     Family History: The patient's family history includes Alcohol abuse in her father; Bladder Cancer in her brother; Cancer in her father; Diabetes in her sister; Heart disease in her sister; Hodgkin's lymphoma in her brother; Lupus in her mother. There is no history of Breast cancer.  ROS:   Please see the history of present illness.     All other systems reviewed and are negative.  EKGs/Labs/Other Studies Reviewed:    The following studies were reviewed today: Stress echocardiogram and cardiac catheterization from 2017 at [facility]      Recent Labs: 05/16/2023: BUN 15; Creatinine, Ser 0.71; Hemoglobin 14.7; Platelets 208; Potassium 3.6; Sodium 138  Recent Lipid Panel    Component Value Date/Time  CHOL 176 03/02/2022 1136   TRIG 282.0 (H) 03/02/2022 1136   HDL 39.50 03/02/2022 1136   CHOLHDL 4 03/02/2022 1136   VLDL 56.4 (H) 03/02/2022 1136   LDLDIRECT 106.0 03/02/2022 1136     Risk Assessment/Calculations:      HYPERTENSION CONTROL Vitals:   06/02/23 1540 06/03/23 1718  BP: (!) 148/68 (!) 142/62    The patient's blood pressure is elevated above target today.  In order to address the patient's elevated BP: Blood pressure will be monitored at home to determine if medication changes need to be made.            Physical Exam:    VS:  BP (!) 142/62   Pulse 81   Ht 5\' 2"  (1.575 m)   Wt 163 lb 12.8 oz (74.3 kg)   SpO2 95%   BMI 29.96 kg/m     Wt Readings from Last 3 Encounters:  06/02/23 163 lb 12.8 oz (74.3 kg)  05/18/23 166 lb (75.3 kg)  05/16/23 160 lb (72.6 kg)     GEN: Overweight, well nourished, well developed in no acute distress HEENT: Normal NECK: No JVD; No carotid bruits LYMPHATICS: No lymphadenopathy CARDIAC: RRR, no murmurs, rubs, gallops RESPIRATORY:  Clear to auscultation without rales, wheezing or rhonchi  ABDOMEN: Soft, non-tender, non-distended MUSCULOSKELETAL:  No edema; No deformity  SKIN: Warm and  dry NEUROLOGIC:  Alert and oriented x 3 PSYCHIATRIC:  Normal affect   ASSESSMENT:    1. Precordial pain   2. Pre-procedure lab exam   3. Shortness of breath   4. Hyperlipidemia, unspecified hyperlipidemia type    PLAN:    In order of problems listed above:  Chest discomfort: This is quite atypical and inconsistent, but she does have evidence of coronary atherosclerosis on imaging studies.  Will schedule for coronary CT angiography.  Hold metformin on the day of the procedure.  Will require beta-blocker for optimization of images. HLP: Need to update her lipid profile.  Regardless of whether or not we find obstructive disease on the CT angiogram would recommend a target LDL cholesterol less than 70.  She has low HDL and elevated triglycerides as well as diabetes mellitus, all pointing to a highly atherogenic lipid profile, probably with small dense LDL and then increased risk of future development of cardiovascular problems. HTN: Her blood pressure is slightly elevated today but she is clearly very nervous.  Just 2 weeks ago her blood pressure was more acceptable at 136/78.  No changes made to her medications today.  Will monitor at home. History of Hodgkin's lymphoma: Treated with Rituxan without recurrence in over 15 years.  She did not receive radiation therapy.           Medication Adjustments/Labs and Tests Ordered: Current medicines are reviewed at length with the patient today.  Concerns regarding medicines are outlined above.  Orders Placed This Encounter  Procedures   CT CORONARY MORPH W/CTA COR W/SCORE W/CA W/CM &/OR WO/CM   Basic metabolic panel   Lipid panel   Meds ordered this encounter  Medications   metoprolol tartrate (LOPRESSOR) 100 MG tablet    Sig: Take 1 tablet (100 mg total) by mouth once for 1 dose. Take 2 hrs prior to cardiac testing    Dispense:  1 tablet    Refill:  0    Patient Instructions  Medication Instructions:  TAKE 1 tablet Metoprolol  Tartrate (LOPRESSOR) 100MG  two hours before cardiac testing.    *If you need a  refill on your cardiac medications before your next appointment, please call your pharmacy*   Lab Work: Basic Metabolic Panel  Lipid Panel : Having drawn at PCP   If you have labs (blood work) drawn today and your tests are completely normal, you will receive your results only by: MyChart Message (if you have MyChart) OR A paper copy in the mail If you have any lab test that is abnormal or we need to change your treatment, we will call you to review the results.   Testing/Procedures:   Your cardiac CT will be scheduled at one of the below locations:   [facility] [address] [place], [place] [address] [phone]    If scheduled at [facility], please arrive at the [facility] and Children's Entrance (Entrance C2) of [facility] 30 minutes prior to test start time. You can use the FREE valet parking offered at entrance C (encouraged to control the heart rate for the test)  Proceed to the [facility] Radiology Department (first floor) to check-in and test prep.  All radiology patients and guests should use entrance C2 at [facility], accessed from [facility], even though the hospital's physical address listed is [address]    If scheduled at [facility] or [facility], please arrive 15 mins early for check-in and test prep.  There is spacious parking and easy access to the radiology department from the [facility] Heart and Vascular entrance. Please enter here and check-in with the desk attendant.   If scheduled at [facility], please arrive 30 minutes early for check-in and test prep.  Please follow these instructions carefully (unless otherwise directed):  An IV will be required for this test and Nitroglycerin will be given.    On the Night Before the Test: Be sure to Drink  plenty of water. Do not consume any caffeinated/decaffeinated beverages or chocolate 12 hours prior to your test. Do not take any antihistamines 12 hours prior to your test.   On the Day of the Test: Drink plenty of water until 1 hour prior to the test. Do not eat any food 1 hour prior to test. You may take your regular medications prior to the test.  Take metoprolol (Lopressor) two hours prior to test. If you take Furosemide/Hydrochlorothiazide/Spironolactone/Chlorthalidone, please HOLD on the morning of the test. Patients who wear a continuous glucose monitor MUST remove the device prior to scanning. FEMALES- please wear underwire-free bra if available, avoid dresses & tight clothing HOLD metFORMIN (GLUCOPHAGE) 1000 MG tablet Morning of test       After the Test: Drink plenty of water. After receiving IV contrast, you may experience a mild flushed feeling. This is normal. On occasion, you may experience a mild rash up to 24 hours after the test. This is not dangerous. If this occurs, you can take Benadryl 25 mg, Zyrtec, Claritin, or Allegra and increase your fluid intake. (Patients taking Tikosyn should avoid Benadryl, and may take Zyrtec, Claritin, or Allegra) If you experience trouble breathing, this can be serious. If it is severe call 911 IMMEDIATELY. If it is mild, please call our office.  We will call to schedule your test 2-4 weeks out understanding that some insurance companies will need an authorization prior to the service being performed.   For more information and frequently asked questions, please visit our website : [email or web address]  For non-scheduling related questions, please contact the cardiac imaging nurse navigator  should you have any questions/concerns: Cardiac Imaging Nurse Navigators Direct Office Dial: [phone]   For scheduling needs, including cancellations and rescheduling, please call [place], [phone].    Follow-Up: At [facility], you and your health needs are our priority.  As part of our continuing mission to provide you with exceptional heart care, we have created designated Provider Care Teams.  These Care Teams include your primary Cardiologist (physician) and Advanced Practice Providers (APPs -  Physician Assistants and Nurse Practitioners) who all work together to provide you with the care you need, when you need it.  We recommend signing up for the patient portal called "MyChart".  Sign up information is provided on this After Visit Summary.  MyChart is used to connect with patients for Virtual Visits (Telemedicine).  Patients are able to view lab/test results, encounter notes, upcoming appointments, etc.  Non-urgent messages can be sent to your provider as well.   To learn more about what you can do with MyChart, go to [email or web address].    Your next appointment:   3 month(s)  The format for your next appointment:   In Person  Provider:   Dr. Royann Shivers    Other Instructions    Signed, Thurmon Fair, MD  06/03/2023 5:22 PM    [facility]

## 2023-06-04 ENCOUNTER — Encounter: Payer: Self-pay | Admitting: Internal Medicine

## 2023-06-04 DIAGNOSIS — R072 Precordial pain: Secondary | ICD-10-CM

## 2023-06-09 ENCOUNTER — Encounter: Payer: Self-pay | Admitting: Internal Medicine

## 2023-06-10 ENCOUNTER — Other Ambulatory Visit (INDEPENDENT_AMBULATORY_CARE_PROVIDER_SITE_OTHER)

## 2023-06-10 ENCOUNTER — Encounter: Payer: Self-pay | Admitting: Internal Medicine

## 2023-06-10 DIAGNOSIS — R072 Precordial pain: Secondary | ICD-10-CM

## 2023-06-10 LAB — RENAL FUNCTION PANEL
Albumin: 4.2 g/dL (ref 3.5–5.2)
BUN: 13 mg/dL (ref 6–23)
CO2: 29 meq/L (ref 19–32)
Calcium: 9.1 mg/dL (ref 8.4–10.5)
Chloride: 102 meq/L (ref 96–112)
Creatinine, Ser: 0.68 mg/dL (ref 0.40–1.20)
GFR: 88.71 mL/min (ref >60.00)
Glucose, Bld: 202 mg/dL — ABNORMAL HIGH (ref 70–99)
Phosphorus: 3.6 mg/dL (ref 2.3–4.6)
Potassium: 4.2 meq/L (ref 3.5–5.1)
Sodium: 139 meq/L (ref 135–145)

## 2023-06-10 LAB — LIPID PANEL
Cholesterol: 140 mg/dL (ref 0–200)
HDL: 37.4 mg/dL — ABNORMAL LOW (ref >39.00)
LDL Cholesterol: 60 mg/dL (ref 0–99)
NonHDL: 102.54
Total CHOL/HDL Ratio: 4
Triglycerides: 215 mg/dL — ABNORMAL HIGH (ref 0.0–149.0)
VLDL: 43 mg/dL — ABNORMAL HIGH (ref 0.0–40.0)

## 2023-06-17 ENCOUNTER — Encounter (HOSPITAL_COMMUNITY): Payer: Self-pay

## 2023-06-21 ENCOUNTER — Encounter: Payer: Self-pay | Admitting: Cardiovascular Disease

## 2023-06-21 ENCOUNTER — Ambulatory Visit (HOSPITAL_COMMUNITY)
Admission: RE | Admit: 2023-06-21 | Discharge: 2023-06-21 | Disposition: A | Discharge status: Home / Self Care | Payer: Medicare Other | Source: Ambulatory Visit | Attending: Cardiovascular Disease | Admitting: Cardiovascular Disease

## 2023-06-21 DIAGNOSIS — R0602 Shortness of breath: Secondary | ICD-10-CM

## 2023-06-21 DIAGNOSIS — R072 Precordial pain: Secondary | ICD-10-CM

## 2023-06-21 MED ORDER — NITROGLYCERIN 0.4 MG SL SUBL
SUBLINGUAL_TABLET | SUBLINGUAL | Status: AC
  Filled 2023-06-21: qty 2

## 2023-06-21 MED ORDER — IOHEXOL 350 MG/ML SOLN
95.0000 mL | Freq: Once | INTRAVENOUS | Status: AC | PRN
  Administered 2023-06-21: 95 mL via INTRAVENOUS

## 2023-06-21 MED ORDER — NITROGLYCERIN 0.4 MG SL SUBL
0.8000 mg | SUBLINGUAL_TABLET | Freq: Once | SUBLINGUAL | Status: AC
  Administered 2023-06-21: 0.8 mg via SUBLINGUAL

## 2023-07-21 ENCOUNTER — Other Ambulatory Visit: Payer: Self-pay | Admitting: Internal Medicine

## 2023-08-19 ENCOUNTER — Ambulatory Visit

## 2023-09-06 ENCOUNTER — Encounter: Payer: Self-pay | Admitting: Acute Care

## 2023-09-17 ENCOUNTER — Telehealth: Admitting: Emergency Medicine

## 2023-09-17 ENCOUNTER — Ambulatory Visit: Payer: Self-pay

## 2023-09-17 DIAGNOSIS — L259 Unspecified contact dermatitis, unspecified cause: Secondary | ICD-10-CM | POA: Diagnosis not present

## 2023-09-17 MED ORDER — PREDNISONE 20 MG PO TABS: ORAL_TABLET | ORAL | 0 refills | Status: DC

## 2023-09-17 NOTE — Telephone Encounter (Signed)
 FYI Only or Action Required?: FYI only for provider  Patient was last seen in primary care on 05/18/2023 by Helaine Llanos, MD. Called Nurse Triage reporting Rash. Symptoms began several days ago. Interventions attempted: OTC medications: cortisone cream and calamine lotion. Symptoms are: gradually worsening.  Triage Disposition: See HCP Within 4 Hours (Or PCP Triage) Pt doing virtual UC  Patient/caregiver understands and will follow disposition?: YesCopied from CRM [id number]. Topic: Clinical - Red Word Triage >> Sep 17, 2023  8:25 AM Danae Duncans wrote: Red Word that prompted transfer to Nurse Triage: came back from leaving out of state Monday- cut grass on lawn mower- allergic reaction on forehead(formed on Wednesday) been applying cortisone 10 - now bumps and rash moving into left eye(swollen) , Reason for Disposition  Large or small blisters on skin (i.e., fluid filled bubbles or sacs)  Answer Assessment - Initial Assessment Questions 1. APPEARANCE of RASH: Describe the rash. (e.g., spots, blisters, raised areas, skin peeling, scaly)     Red, raised, blisters 2. SIZE: How big are the spots? (e.g., tip of pen, eraser, coin; inches, centimeters)     Big splotch  3. LOCATION: Where is the rash located?     face 4. COLOR: What color is the rash? (Note: It is difficult to assess rash color in people with darker-colored skin. When this situation occurs, simply ask the caller to describe what they see.)     red 5. ONSET: When did the rash begin?     Wednesday  6. FEVER: Do you have a fever? If Yes, ask: What is your temperature, how was it measured, and when did it start?     Denies  7. ITCHING: Does the rash itch? If Yes, ask: How bad is the itch? (Scale 1-10; or mild, moderate, severe)     Tolerable with cortisone cream and calamine lotion 8. CAUSE: What do you think is causing the rash?     Cutting grass 9. MEDICINE FACTORS: Have you started any new medicines within  the last 2 weeks? (e.g., antibiotics)      denies 10. OTHER SYMPTOMS: Do you have any other symptoms? (e.g., dizziness, headache, sore throat, joint pain)       denies    Rash moved near left eye. Eye is watering/itching/swollen. Bumps in corner of eye. Pt has not tried Benadryl due to caring for brother. 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 Appt is 1030 this morning.  Protocols used: Rash or Redness - [facility]

## 2023-09-17 NOTE — Progress Notes (Signed)
 Virtual Visit Consent   Virginia Floyd, you are scheduled for a virtual visit with a [facility] provider today. Just as with appointments in the office, your consent must be obtained to participate. Your consent will be active for this visit and any virtual visit you may have with one of our providers in the next 365 days. If you have a MyChart account, a copy of this consent can be sent to you electronically.  As this is a virtual visit, video technology does not allow for your provider to perform a traditional examination. This may limit your provider's ability to fully assess your condition. If your provider identifies any concerns that need to be evaluated in person or the need to arrange testing (such as labs, EKG, etc.), we will make arrangements to do so. Although advances in technology are sophisticated, we cannot ensure that it will always work on either your end or our end. If the connection with a video visit is poor, the visit may have to be switched to a telephone visit. With either a video or telephone visit, we are not always able to ensure that we have a secure connection.  By engaging in this virtual visit, you consent to the provision of healthcare and authorize for your insurance to be billed (if applicable) for the services provided during this visit. Depending on your insurance coverage, you may receive a charge related to this service.  I need to obtain your verbal consent now. Are you willing to proceed with your visit today? Virginia Floyd has provided verbal consent on 09/17/2023 for a virtual visit (video or telephone). Sherel Dikes, PA-C  Date: 09/17/2023 10:34 AM   Virtual Visit via Video Note   I, Sherel Dikes, connected with  Virginia Floyd  ([id number], 12/21/1955) on 09/17/23 at 10:30 AM EDT by a video-enabled telemedicine application and verified that I am speaking with the correct person using two identifiers.  Location: Patient: Virtual Visit Location Patient:  Home Provider: Virtual Visit Location Provider: Home Office   I discussed the limitations of evaluation and management by telemedicine and the availability of in person appointments. The patient expressed understanding and agreed to proceed.    History of Present Illness: Virginia Floyd is a 68 y.o. who identifies as a female who was assigned female at birth, and is being seen today for rash on the forehead and is moving to her bilateral cheeks and eyelids.  She states that she was cutting the grass the other night and then the rash popped up.  States that it is very itchy.  States that she has had some swelling of the eyelid.  She has tried using cortisone and calamine, but hasn't taken anything else.  She states that she doesn't have the rash anywhere else.   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   History of lymphoma 06/01/2015   Nerve pain 06/01/2015   Hodgkin's lymphoma (HCC) 2008    Allergies:  Allergies  Allergen Reactions   Latex Swelling   Codeine Hives, Itching and Nausea And Vomiting   Sulfa Antibiotics Hives, Itching and Nausea And Vomiting   Medications:  Current Outpatient Medications:    predniSONE (DELTASONE) 20 MG tablet, Take 40 mg by mouth daily for 5 days, then 20mg  by mouth daily for 5 days, then 10mg  daily for 5 days, Disp: 18 tablet, Rfl: 0   atorvastatin  (LIPITOR) 40  MG tablet, TAKE 1 TABLET (40 MG TOTAL) BY MOUTH DAILY., Disp: 90 tablet, Rfl: 3   DULoxetine  (CYMBALTA ) 30 MG capsule, Take 1 capsule (30 mg total) by mouth daily., Disp: 90 capsule, Rfl: 3   gabapentin  (NEURONTIN ) 400 MG capsule, TAKE 1 CAPSULE BY MOUTH AT BEDTIME, Disp: 90 capsule, Rfl: 0   glipiZIDE  (GLUCOTROL  XL) 10 MG 24 hr tablet, Take 1 tablet (10 mg total) by mouth daily., Disp: 90 tablet, Rfl: 3   levothyroxine  (SYNTHROID ) 75 MCG tablet, TAKE 1 TABLET (75 MCG TOTAL) BY MOUTH DAILY., Disp: 90 tablet, Rfl: 3   metFORMIN  (GLUCOPHAGE ) 1000 MG tablet, Take 1 tablet (1,000 mg total) by mouth 2 (two) times daily with a meal., Disp: 180 tablet, Rfl: 3   metoprolol  tartrate (LOPRESSOR ) 100 MG tablet, Take 1 tablet (100 mg total) by mouth once for 1 dose. Take 2 hrs prior to cardiac testing, Disp: 1 tablet, Rfl: 0  Observations/Objective: Patient is well-developed, well-nourished in no acute distress.  Resting comfortably at home.  Head is normocephalic, atraumatic.  No labored breathing.  Speech is clear and coherent with logical content.  Patient is alert and oriented at baseline.    Assessment and Plan: 1. Contact dermatitis, unspecified contact dermatitis type, unspecified trigger (Primary)   Meds ordered this encounter  Medications   predniSONE (DELTASONE) 20 MG tablet    Sig: Take 40 mg by mouth daily for 5 days, then 20mg  by mouth daily for 5 days, then 10mg  daily for 5 days    Dispense:  18 tablet    Refill:  0    Supervising Provider:   Corine Dice [[id number]]   Onset after working in the yard.  Concern for contact dermatitis.  Shingles thought less likely since the rash crosses midline.  F/u in 24-48 hours if not improving.  Follow Up Instructions: I discussed the assessment and treatment plan with the patient. The patient was provided an opportunity to ask questions and all were answered. The patient agreed with the plan and demonstrated an understanding of the  instructions.  A copy of instructions were sent to the patient via MyChart unless otherwise noted below.     The patient was advised to call back or seek an in-person evaluation if the symptoms worsen or if the condition fails to improve as anticipated.    Sherel Dikes, PA-C

## 2023-09-17 NOTE — Patient Instructions (Signed)
  Virginia Floyd, thank you for joining Sherel Dikes, PA-C for today's virtual visit.  While this provider is not your primary care provider (PCP), if your PCP is located in our provider database this encounter information will be shared with them immediately following your visit.   A [facility] MyChart account gives you access to today's visit and all your visits, tests, and labs performed at [facility]  click here if you don't have a [facility] MyChart account or go to [email or web address]  Consent: (Patient) Virginia Floyd provided verbal consent for this virtual visit at the beginning of the encounter.  Current Medications:  Current Outpatient Medications:    predniSONE (DELTASONE) 20 MG tablet, Take 40 mg by mouth daily for 5 days, then 20mg  by mouth daily for 5 days, then 10mg  daily for 5 days, Disp: 18 tablet, Rfl: 0   atorvastatin  (LIPITOR) 40 MG tablet, TAKE 1 TABLET (40 MG TOTAL) BY MOUTH DAILY., Disp: 90 tablet, Rfl: 3   DULoxetine  (CYMBALTA ) 30 MG capsule, Take 1 capsule (30 mg total) by mouth daily., Disp: 90 capsule, Rfl: 3   gabapentin  (NEURONTIN ) 400 MG capsule, TAKE 1 CAPSULE BY MOUTH AT BEDTIME, Disp: 90 capsule, Rfl: 0   glipiZIDE  (GLUCOTROL  XL) 10 MG 24 hr tablet, Take 1 tablet (10 mg total) by mouth daily., Disp: 90 tablet, Rfl: 3   levothyroxine  (SYNTHROID ) 75 MCG tablet, TAKE 1 TABLET (75 MCG TOTAL) BY MOUTH DAILY., Disp: 90 tablet, Rfl: 3   metFORMIN  (GLUCOPHAGE ) 1000 MG tablet, Take 1 tablet (1,000 mg total) by mouth 2 (two) times daily with a meal., Disp: 180 tablet, Rfl: 3   metoprolol  tartrate (LOPRESSOR ) 100 MG tablet, Take 1 tablet (100 mg total) by mouth once for 1 dose. Take 2 hrs prior to cardiac testing, Disp: 1 tablet, Rfl: 0   Medications ordered in this encounter:  Meds ordered this encounter  Medications   predniSONE (DELTASONE) 20 MG tablet    Sig: Take 40 mg by mouth daily for 5 days, then 20mg  by mouth daily for 5 days, then 10mg   daily for 5 days    Dispense:  18 tablet    Refill:  0    Supervising Provider:   Corine Dice [phone]     *If you need refills on other medications prior to your next appointment, please contact your pharmacy*  Follow-Up: Call back or seek an in-person evaluation if the symptoms worsen or if the condition fails to improve as anticipated.  [facility] [phone]  Other Instructions    If you have been instructed to have an in-person evaluation today at a local Urgent Care facility, please use the link below. It will take you to a list of all of our available [facility], including address, phone number and hours of operation. Please do not delay care.  [facility]  If you or a family member do not have a primary care provider, use the link below to schedule a visit and establish care. When you choose a [facility] primary care physician or advanced practice provider, you gain a long-term partner in health. Find a Primary Care Provider  Learn more about [facility]'s in-office and virtual care options:  - Get Care Now

## 2023-10-29 ENCOUNTER — Other Ambulatory Visit: Payer: Self-pay | Admitting: Internal Medicine

## 2023-11-01 NOTE — Telephone Encounter (Signed)
 Spoke to pt. Advised her it was sent this morning.

## 2023-11-01 NOTE — Telephone Encounter (Unsigned)
 Copied from CRM [id number]. Topic: Clinical - Prescription Issue >> Nov 01, 2023 11:10 AM Arm[place] J wrote: Reason for CRM: Patient's pharmacy is stating that they did not receive the patient's gabapentin  (NEURONTIN ) 400 MG capsule. She is asking if we could resend the script.

## 2023-11-08 ENCOUNTER — Encounter: Payer: Self-pay | Admitting: Internal Medicine

## 2023-11-08 ENCOUNTER — Ambulatory Visit (INDEPENDENT_AMBULATORY_CARE_PROVIDER_SITE_OTHER): Admitting: Internal Medicine

## 2023-11-08 VITALS — BP 146/70 | HR 83 | Temp 98.5°F | Ht 62.0 in | Wt 162.6 lb

## 2023-11-08 DIAGNOSIS — I2510 Atherosclerotic heart disease of native coronary artery without angina pectoris: Secondary | ICD-10-CM

## 2023-11-08 DIAGNOSIS — F39 Unspecified mood [affective] disorder: Secondary | ICD-10-CM | POA: Diagnosis not present

## 2023-11-08 DIAGNOSIS — E1159 Type 2 diabetes mellitus with other circulatory complications: Secondary | ICD-10-CM | POA: Diagnosis not present

## 2023-11-08 DIAGNOSIS — Z7984 Long term (current) use of oral hypoglycemic drugs: Secondary | ICD-10-CM | POA: Diagnosis not present

## 2023-11-08 LAB — POCT GLYCOSYLATED HEMOGLOBIN (HGB A1C): HbA1c, POC (controlled diabetic range): 8.2 % — AB (ref 0.0–7.0)

## 2023-11-08 LAB — HM DIABETES FOOT EXAM

## 2023-11-08 MED ORDER — GABAPENTIN 400 MG PO CAPS: 400.0000 mg | ORAL_CAPSULE | Freq: Every day | ORAL | 3 refills | Status: DC

## 2023-11-08 NOTE — Assessment & Plan Note (Signed)
 High calcium  count but no obstructions Just on the statin

## 2023-11-08 NOTE — Assessment & Plan Note (Signed)
 Low level anxiety and dysthmia Does well with the duloxetine 

## 2023-11-08 NOTE — Progress Notes (Signed)
 Subjective:    Patient ID: Virginia Floyd, female    DOB: 09-May-1955, 68 y.o.   MRN: [id number]  HPI Here to discuss travel plans for medical issues  Hasn't been checking sugars Continues the gabapentin  just at bedtime  Did have coronary CT---no significant obstruction, but high calcium  count No action --just on the statin  Has trip to [place] scheduled [employer] tour Discussed --no special travel precautions are recommended Did have hep A/B series when she was working  Current Outpatient Medications on File Prior to Visit  Medication Sig Dispense Refill   atorvastatin  (LIPITOR) 40 MG tablet TAKE 1 TABLET (40 MG TOTAL) BY MOUTH DAILY. 90 tablet 3   DULoxetine  (CYMBALTA ) 30 MG capsule Take 1 capsule (30 mg total) by mouth daily. 90 capsule 3   gabapentin  (NEURONTIN ) 400 MG capsule TAKE 1 CAPSULE AT BEDTIME 90 capsule 0   glipiZIDE  (GLUCOTROL  XL) 10 MG 24 hr tablet Take 1 tablet (10 mg total) by mouth daily. 90 tablet 3   levothyroxine  (SYNTHROID ) 75 MCG tablet TAKE 1 TABLET (75 MCG TOTAL) BY MOUTH DAILY. 90 tablet 3   metFORMIN  (GLUCOPHAGE ) 1000 MG tablet Take 1 tablet (1,000 mg total) by mouth 2 (two) times daily with a meal. 180 tablet 3   No current facility-administered medications on file prior to visit.    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   OAB (overactive bladder)     Past Surgical History:  Procedure Laterality Date   ABDOMINAL HYSTERECTOMY     CHOLECYSTECTOMY     LASIK     LIPOSUCTION     laser --for abdomen   TRIGGER FINGER RELEASE     multiple     Family History  Problem Relation Age of Onset   Lupus Mother    Alcohol abuse Father    Cancer Father    Heart disease Sister    Diabetes Sister    Hodgkin's lymphoma Brother    Bladder Cancer Brother    Breast cancer Neg Hx     Social History   Socioeconomic History   Marital status: Widowed    Spouse name: Not on file   Number of children: 1   Years of education: Not on file   Highest education level: Master's degree (e.g., MA, MS, MEng, MEd, MSW, MBA)  Occupational History   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    Comment: Retired  Tobacco Use   Smoking status: Former    Current packs/day: 0.00    Average packs/day: 0.8 packs/day for 43.0 years (32.3 ttl pk-yrs)    Types: Cigarettes    Start date: 9    Quit date: 2015    Years since quitting: 10.5    Passive exposure: Past   Smokeless tobacco: Never  Vaping Use   Vaping status: Never Used  Substance and Sexual Activity   Alcohol use: Not Currently   Drug use: Not Currently   Sexual activity: Not on file  Other Topics Concern   Not on file  Social History Narrative   Has living  will   Son Eulas should be decision maker   Woould accept resuscitations   Social Drivers of Health   Financial Resource Strain: Low Risk  (11/07/2023)   Overall Financial Resource Strain (CARDIA)    Difficulty of Paying Living Expenses: Not hard at all  Food Insecurity: No Food Insecurity (11/07/2023)   Hunger Vital Sign    Worried About Running Out of Food in the Last Year: Never true    Ran Out of Food in the Last Year: Never true  Transportation Needs: No Transportation Needs (11/07/2023)   PRAPARE - Administrator, Civil Service (Medical): No    Lack of Transportation (Non-Medical): No  Physical Activity: Inactive (11/07/2023)   Exercise Vital Sign    Days of Exercise per Week: 0 days    Minutes of Exercise per Session: Not on file  Stress: No Stress Concern Present (11/07/2023)   [employer]  of Occupational Health - Occupational Stress Questionnaire    Feeling of Stress: Only a little  Social Connections: Moderately Isolated (11/07/2023)   Social Connection and Isolation Panel    Frequency of Communication with Friends and Family: Twice a week    Frequency of Social Gatherings with Friends and Family: Twice a week    Attends Religious Services: Never    Database administrator or Organizations: Yes    Attends Engineer, structural: More than 4 times per year    Marital Status: Widowed  Catering manager Violence: Not on file   Review of Systems Weight stable Some sleep issues--uses melatonin and it helps some    Objective:   Physical Exam Constitutional:      Appearance: Normal appearance.  Cardiovascular:     Rate and Rhythm: Normal rate and regular rhythm.     Pulses: Normal pulses.     Heart sounds: No murmur heard.    No gallop.  Pulmonary:     Effort: Pulmonary effort is normal.     Breath sounds: Normal breath sounds. No wheezing or rales.  Musculoskeletal:     Cervical back: Neck supple.     Right lower leg: No edema.     Left lower leg: No edema.  Lymphadenopathy:     Cervical: No cervical adenopathy.  Skin:    Comments: No foot lesions  Neurological:     Mental Status: She is alert.     Comments: Normal sensation in feet  Psychiatric:        Mood and Affect: Mood normal.        Behavior: Behavior normal.            Assessment & Plan:

## 2023-11-08 NOTE — Assessment & Plan Note (Addendum)
 Hasn't been checking sugars A1c up to 8.2% Only taking metformin  1000 daily and glipizide  10 Discussed alternatives---I recommend farxiga/jardiance but she prefers no new meds till she is back from her trip Will increase both metformin  to twice a day Gabapentin  at bedtime Needs follow up in 3 months

## 2023-11-30 ENCOUNTER — Encounter: Payer: Self-pay | Admitting: Internal Medicine

## 2023-11-30 ENCOUNTER — Ambulatory Visit (INDEPENDENT_AMBULATORY_CARE_PROVIDER_SITE_OTHER): Admitting: Internal Medicine

## 2023-11-30 VITALS — BP 126/70 | HR 86 | Temp 98.0°F | Ht 62.0 in | Wt 161.0 lb

## 2023-11-30 DIAGNOSIS — J0100 Acute maxillary sinusitis, unspecified: Secondary | ICD-10-CM | POA: Insufficient documentation

## 2023-11-30 MED ORDER — DOXYCYCLINE HYCLATE 100 MG PO TABS: 100.0000 mg | ORAL_TABLET | Freq: Two times a day (BID) | ORAL | 0 refills | Status: DC

## 2023-11-30 NOTE — Progress Notes (Signed)
 Subjective:    Patient ID: Virginia Floyd, female    DOB: [birth year], 68 y.o.   MRN: [id number]  HPI Here due to eye and respiratory symptoms  Went to [place] --- brother died Spent time in house with him---and a lot of cats Thinks that kicked something in  Mostly eye and sinus symptoms--started with pain in her eyes Then itchy and watery---uses clear eyes (temporary help) Some eye swelling Maxillary headache  No sore throat or ear pain No fever No SOB  No other Rx---has imodium but hasn't taken  Current Outpatient Medications on File Prior to Visit  Medication Sig Dispense Refill   atorvastatin  (LIPITOR) 40 MG tablet TAKE 1 TABLET (40 MG TOTAL) BY MOUTH DAILY. 90 tablet 3   DULoxetine  (CYMBALTA ) 30 MG capsule Take 1 capsule (30 mg total) by mouth daily. 90 capsule 3   gabapentin  (NEURONTIN ) 400 MG capsule Take 1 capsule (400 mg total) by mouth at bedtime. 90 capsule 3   glipiZIDE  (GLUCOTROL  XL) 10 MG 24 hr tablet Take 1 tablet (10 mg total) by mouth daily. 90 tablet 3   levothyroxine  (SYNTHROID ) 75 MCG tablet TAKE 1 TABLET (75 MCG TOTAL) BY MOUTH DAILY. 90 tablet 3   metFORMIN  (GLUCOPHAGE ) 1000 MG tablet Take 1 tablet (1,000 mg total) by mouth 2 (two) times daily with a meal. 180 tablet 3   No current facility-administered medications on file prior to visit.    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   OAB (overactive bladder)     Past Surgical History:  Procedure Laterality Date   ABDOMINAL HYSTERECTOMY     CHOLECYSTECTOMY     LASIK     LIPOSUCTION      laser --for abdomen   TRIGGER FINGER RELEASE     multiple    Family History  Problem Relation Age of Onset   Lupus Mother    Alcohol abuse Father    Cancer Father    Heart disease Sister    Diabetes Sister    Hodgkin's lymphoma Brother    Bladder Cancer Brother    Breast cancer Neg Hx     Social History   Socioeconomic History   Marital status: Widowed    Spouse name: Not on file   Number of children: 1   Years of education: Not on file   Highest education level: Master's degree (e.g., MA, MS, MEng, MEd, MSW, MBA)  Occupational History   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    Comment: Retired  Tobacco Use   Smoking status: Former    Current packs/day: 0.00    Average packs/day: 0.8 packs/day for 43.0 years (32.3 ttl pk-yrs)    Types: Cigarettes    Start date: 42    Quit date: 2015    Years since quitting: 10.6    Passive exposure: Past   Smokeless tobacco: Never  Vaping Use   Vaping status: Never Used  Substance and Sexual Activity   Alcohol use: Not Currently   Drug use: Not Currently   Sexual activity: Not on file  Other Topics Concern   Not on file  Social History Narrative   Has living will   Son Eulas should be decision maker   Woould accept resuscitations   Social Drivers of Health   Financial Resource Strain: Low Risk  (11/07/2023)   Overall Financial Resource Strain (CARDIA)    Difficulty of Paying Living Expenses: Not hard at all  Food Insecurity: No Food Insecurity (11/07/2023)   Hunger Vital Sign    Worried About Running Out of Food in the Last Year: Never true    Ran Out of Food in the Last Year: Never true  Transportation Needs: No Transportation Needs (11/07/2023)   PRAPARE - Administrator, Civil Service (Medical): No    Lack of Transportation (Non-Medical): No  Physical Activity: Inactive (11/07/2023)   Exercise Vital Sign    Days of Exercise per Week: 0 days    Minutes of Exercise per Session: Not on file  Stress:  No Stress Concern Present (11/07/2023)   [employer] of Occupational Health - Occupational Stress Questionnaire    Feeling of Stress: Only a little  Social Connections: Moderately Isolated (11/07/2023)   Social Connection and Isolation Panel    Frequency of Communication with Friends and Family: Twice a week    Frequency of Social Gatherings with Friends and Family: Twice a week    Attends Religious Services: Never    Database administrator or Organizations: Yes    Attends Engineer, structural: More than 4 times per year    Marital Status: Widowed  Catering manager Violence: Not on file   Review of Systems Loose frequent stools since back over the last week No sig abdominal pain No vomiting---occ nausea Irregular eating--but is eating    Objective:   Physical Exam Constitutional:      Appearance: Normal appearance.  HENT:     Head:     Comments: Mild maxillary tenderness    Right Ear: Tympanic membrane and ear canal normal.     Left Ear: Tympanic membrane and ear canal normal.     Mouth/Throat:     Pharynx: No oropharyngeal exudate or posterior oropharyngeal erythema.  Eyes:     Conjunctiva/sclera: Conjunctivae normal.  Pulmonary:     Effort: Pulmonary effort is normal.     Breath sounds: Normal breath sounds. No wheezing or rales.  Musculoskeletal:     Cervical back: Neck supple.  Lymphadenopathy:     Cervical: No cervical adenopathy.  Neurological:     Mental Status: She is alert.            Assessment & Plan:

## 2023-11-30 NOTE — Assessment & Plan Note (Addendum)
 May have caught something on plane Will try doxy 100  bid x 7 days Analgescis prn  If eyes recur, can try naphcon A

## 2023-12-07 ENCOUNTER — Other Ambulatory Visit: Payer: Self-pay | Admitting: Internal Medicine

## 2023-12-07 ENCOUNTER — Telehealth: Payer: Self-pay

## 2023-12-07 DIAGNOSIS — E039 Hypothyroidism, unspecified: Secondary | ICD-10-CM

## 2023-12-07 DIAGNOSIS — E785 Hyperlipidemia, unspecified: Secondary | ICD-10-CM

## 2023-12-07 MED ORDER — METFORMIN HCL 1000 MG PO TABS: 1000.0000 mg | ORAL_TABLET | Freq: Two times a day (BID) | ORAL | 3 refills | Status: DC

## 2023-12-07 NOTE — Telephone Encounter (Signed)
 Copied from CRM [id number]. Topic: General - Other >> Dec 07, 2023 11:50 AM Mesmerise C wrote: Reason for CRM: Patient stated she spoke to Dr. Jimmy during appointment  and he stated would be speaking to Dr. Marylynn about discussing taking her on as a new patient Dr.Tullo showing not taking new patients, leaves for [place] on 12th will be gone until the 26th states she's needing refill of Metformin  states she has a friend that sees her as well

## 2023-12-07 NOTE — Addendum Note (Signed)
 Addended by: MARYLYNN VERNEITA CROME on: 12/07/2023 09:08 PM   Modules accepted: Orders

## 2023-12-08 NOTE — Telephone Encounter (Signed)
Please schedule pt for a new pt appt. 

## 2023-12-08 NOTE — Telephone Encounter (Signed)
 noted

## 2024-01-05 ENCOUNTER — Other Ambulatory Visit (INDEPENDENT_AMBULATORY_CARE_PROVIDER_SITE_OTHER)

## 2024-01-05 DIAGNOSIS — E039 Hypothyroidism, unspecified: Secondary | ICD-10-CM

## 2024-01-05 DIAGNOSIS — E785 Hyperlipidemia, unspecified: Secondary | ICD-10-CM | POA: Diagnosis not present

## 2024-01-05 LAB — COMPREHENSIVE METABOLIC PANEL WITH GFR
AG Ratio: 1.9 (calc) (ref 1.0–2.5)
ALT: 24 U/L (ref 6–29)
AST: 18 U/L (ref 10–35)
Albumin: 4.2 g/dL (ref 3.6–5.1)
Alkaline phosphatase (APISO): 113 U/L (ref 37–153)
BUN: 12 mg/dL (ref 7–25)
CO2: 30 mmol/L (ref 20–32)
Calcium: 9.3 mg/dL (ref 8.6–10.4)
Chloride: 104 mmol/L (ref 98–110)
Creat: 0.62 mg/dL (ref 0.60–1.00)
Globulin: 2.2 g/dL (ref 1.9–3.7)
Glucose, Bld: 170 mg/dL — ABNORMAL HIGH (ref 65–99)
Potassium: 4.4 mmol/L (ref 3.5–5.3)
Sodium: 142 mmol/L (ref 135–146)
Total Bilirubin: 0.4 mg/dL (ref 0.2–1.2)
Total Protein: 6.4 g/dL (ref 6.1–8.1)
eGFR: 96 mL/min/1.73m2 (ref >=60)

## 2024-01-05 LAB — TSH: TSH: 10.37 m[IU]/L — ABNORMAL HIGH (ref 0.40–4.50)

## 2024-01-05 NOTE — Addendum Note (Signed)
 Addended by: MARYLEN PRO A on: 01/05/2024 08:07 AM   Modules accepted: Orders

## 2024-01-06 ENCOUNTER — Ambulatory Visit: Payer: Self-pay | Admitting: Internal Medicine

## 2024-01-07 ENCOUNTER — Ambulatory Visit: Admitting: Internal Medicine

## 2024-01-07 ENCOUNTER — Encounter: Payer: Self-pay | Admitting: Internal Medicine

## 2024-01-07 VITALS — BP 132/66 | HR 84 | Ht 62.0 in | Wt 161.2 lb

## 2024-01-07 DIAGNOSIS — E039 Hypothyroidism, unspecified: Secondary | ICD-10-CM

## 2024-01-07 DIAGNOSIS — E1159 Type 2 diabetes mellitus with other circulatory complications: Secondary | ICD-10-CM | POA: Diagnosis not present

## 2024-01-07 DIAGNOSIS — Z7984 Long term (current) use of oral hypoglycemic drugs: Secondary | ICD-10-CM | POA: Diagnosis not present

## 2024-01-07 MED ORDER — LEVOTHYROXINE SODIUM 88 MCG PO TABS: 88.0000 ug | ORAL_TABLET | Freq: Every day | ORAL | 0 refills | Status: DC

## 2024-01-07 MED ORDER — MELATONIN 12 MG PO TABS: ORAL_TABLET | ORAL | Status: DC

## 2024-01-07 NOTE — Patient Instructions (Addendum)
 Welcome!  I enjoyed meeting you today!    We will repeat your A1c on or after Nov 4,   tat your next visit   We placed thhe continuous blood glucometer today to allow us  24/7 observation of your glucose trends. Iwill  give us  a chance to review your blood sugars before and after eating, etc  Remind me in 2 weeks to review the information on line   Please start doing some form of exercise daily.  Start small .  Your goal is to EVENTUALLY be able to walk for 30 minutes daily    Your thyroid  is underactive on 75 mcg daily  add one extra  dose per week until you are done with your current bottle,  the next higher dose will come via mial order and you tial takine one 88 mcg tablet daily

## 2024-01-07 NOTE — Progress Notes (Signed)
 Subjective:  Patient ID: Virginia Floyd, female    DOB: April 27, 1955  Age: 68 y.o. MRN: [id number]  CC: The primary encounter diagnosis was Type 2 diabetes mellitus with other circulatory complication, without long-term current use of insulin (HCC). A diagnosis of Acquired hypothyroidism was also pertinent to this visit.  HPI Virginia Floyd presents for transfer of care from [place].  She has type 2 DM managed with maximal doses of glipizide  and metformin  but does not check her blood sugars often .   T2DM:  She  feels generally well,  But is not  exercising  or trying to lose weight.   Denies any recent hypoglyemic events.  Taking   medications as directed. Following a carbohydrate modified diet 6 days per week. Denies numbness, burning and tingling of extremities. Appetite is good.    Hypothyroid:  recent TSH was elevated .  Reviewed proper administration of levothyroxine , which she is doing   History Virginia Floyd has a past medical history of Allergy (2005), Arthritis (2022), Cervical radiculopathy, COPD (chronic obstructive pulmonary disease) (HCC) (2000), Coronary artery disease (05/16/23), Diabetes mellitus without complication (HCC), GERD (gastroesophageal reflux disease) (2005), Hodgkin's lymphoma (HCC) (2008), Hyperlipidemia, unspecified hyperlipidemia type, Hypertension (05/16/23), Hypothyroidism, Mood disorder, and OAB (overactive bladder).   She has a past surgical history that includes Abdominal hysterectomy (2000); Cholecystectomy (2008); Trigger finger release; LASIK; Liposuction; Cardiac catheterization (2016); and Eye surgery (2000).   Her family history includes Alcohol abuse in her father; Arthritis in her mother; Bladder Cancer in her brother; COPD in her brother; Cancer in her brother and father; Diabetes in her sister and sister; Early death in her mother; Heart attack in her maternal grandmother and paternal grandfather; Heart disease in her sister; Heart failure in her sister;  Hodgkin's lymphoma in her brother; Lupus in her mother; Miscarriages / [place] in her daughter.She reports that she quit smoking about 10 years ago. Her smoking use included cigarettes. She started smoking about 53 years ago. She has a 32.3 pack-year smoking history. She has been exposed to tobacco smoke. She has never used smokeless tobacco. She reports that she does not currently use alcohol. She reports that she does not currently use drugs.  Outpatient Medications Prior to Visit  Medication Sig Dispense Refill   atorvastatin  (LIPITOR) 40 MG tablet TAKE 1 TABLET (40 MG TOTAL) BY MOUTH DAILY. 90 tablet 3   DULoxetine  (CYMBALTA ) 30 MG capsule Take 1 capsule (30 mg total) by mouth daily. 90 capsule 3   gabapentin  (NEURONTIN ) 400 MG capsule Take 1 capsule (400 mg total) by mouth at bedtime. 90 capsule 3   glipiZIDE  (GLUCOTROL  XL) 10 MG 24 hr tablet Take 1 tablet (10 mg total) by mouth daily. 90 tablet 3   metFORMIN  (GLUCOPHAGE ) 1000 MG tablet Take 1 tablet (1,000 mg total) by mouth 2 (two) times daily with a meal. 180 tablet 3   levothyroxine  (SYNTHROID ) 75 MCG tablet TAKE 1 TABLET (75 MCG TOTAL) BY MOUTH DAILY. 90 tablet 3   doxycycline  (VIBRA -TABS) 100 MG tablet Take 1 tablet (100 mg total) by mouth 2 (two) times daily. 14 tablet 0   No facility-administered medications prior to visit.    Review of Systems:  Patient denies headache, fevers, malaise, unintentional weight loss, skin rash, eye pain, sinus congestion and sinus pain, sore throat, dysphagia,  hemoptysis , cough, dyspnea, wheezing, chest pain, palpitations, orthopnea, edema, abdominal pain, nausea, melena, diarrhea, constipation, flank pain, dysuria, hematuria, urinary  Frequency, nocturia, numbness, tingling, seizures,  Focal weakness,  Loss of consciousness,  Tremor, insomnia, depression, anxiety, and suicidal ideation.     Objective:  BP 132/66   Pulse 84   Ht 5' 2 (1.575 m)   Wt 161 lb 3.2 oz (73.1 kg)   SpO2 96%   BMI  29.48 kg/m   Physical Exam Vitals reviewed.  Constitutional:      General: She is not in acute distress.    Appearance: Normal appearance. She is normal weight. She is not ill-appearing, toxic-appearing or diaphoretic.  HENT:     Head: Normocephalic.  Eyes:     General: No scleral icterus.       Right eye: No discharge.        Left eye: No discharge.     Conjunctiva/sclera: Conjunctivae normal.  Cardiovascular:     Rate and Rhythm: Normal rate and regular rhythm.     Heart sounds: Normal heart sounds.  Pulmonary:     Effort: Pulmonary effort is normal. No respiratory distress.     Breath sounds: Normal breath sounds.  Musculoskeletal:        General: Normal range of motion.  Skin:    General: Skin is warm and dry.  Neurological:     General: No focal deficit present.     Mental Status: She is alert and oriented to person, place, and time. Mental status is at baseline.  Psychiatric:        Mood and Affect: Mood normal.        Behavior: Behavior normal.        Thought Content: Thought content normal.        Judgment: Judgment normal.     Assessment & Plan:  Type 2 diabetes mellitus with other circulatory complication, without long-term current use of insulin (HCC) Assessment & Plan: Uncontrolled despite maximal doses of glipizide  and metformin .   Urinalysis for protein has not been checked in 2 years. CBG monitor applied today to evaluate glycemic patterns. . Reviewed role of diet and exercise and made recommendations regarding both   Lab Results  Component Value Date   HGBA1C 8.2 (A) 11/08/2023   Lab Results  Component Value Date   LABMICR See below: 02/04/2023       Orders: -     Comprehensive metabolic panel with GFR; Future -     Microalbumin / creatinine urine ratio; Future -     Hemoglobin A1c; Future -     Lipid panel; Future  Acquired hypothyroidism Assessment & Plan: Thyroid  is underactive on 75 mcg daily.  Dose increased to 88 .  Repeat TSH in late  November.   Lab Results  Component Value Date   TSH 10.37 (H) 01/05/2024      Other orders -     Levothyroxine  Sodium; Take 1 tablet (88 mcg total) by mouth daily.  Dispense: 90 tablet; Refill: 0 -     Melatonin; 17 mg at night     Follow-up: Return in about 1 month (around 02/09/2024) for follow up diabetes.  I personally spent a total of 34 minutes in the care of the patient today including preparing to see the patient, getting/reviewing separately obtained history, performing a medically appropriate exam/evaluation, counseling and educating, placing orders, and documenting clinical information in the EHR.   Verneita LITTIE Kettering, MD

## 2024-01-09 NOTE — Assessment & Plan Note (Signed)
 Thyroid  is underactive on 75 mcg daily.  Dose increased to 88 .  Repeat TSH in late November.   Lab Results  Component Value Date   TSH 10.37 (H) 01/05/2024

## 2024-01-09 NOTE — Assessment & Plan Note (Addendum)
 Uncontrolled despite maximal doses of glipizide  and metformin .   Urinalysis for protein has not been checked in 2 years. CBG monitor applied today to evaluate glycemic patterns. . Reviewed role of diet and exercise and made recommendations regarding both   Lab Results  Component Value Date   HGBA1C 8.2 (A) 11/08/2023   Lab Results  Component Value Date   LABMICR See below: 02/04/2023

## 2024-01-20 ENCOUNTER — Encounter: Payer: Self-pay | Admitting: Internal Medicine

## 2024-01-20 NOTE — Telephone Encounter (Signed)
**Note De-identified  [place] Obfuscation** Please advise 

## 2024-01-24 NOTE — Telephone Encounter (Signed)
 Noted

## 2024-02-09 ENCOUNTER — Other Ambulatory Visit

## 2024-02-09 ENCOUNTER — Other Ambulatory Visit (INDEPENDENT_AMBULATORY_CARE_PROVIDER_SITE_OTHER)

## 2024-02-09 DIAGNOSIS — E1159 Type 2 diabetes mellitus with other circulatory complications: Secondary | ICD-10-CM

## 2024-02-09 LAB — COMPREHENSIVE METABOLIC PANEL WITH GFR
ALT: 27 U/L (ref 0–35)
AST: 18 U/L (ref 0–37)
Albumin: 4.1 g/dL (ref 3.5–5.2)
Alkaline Phosphatase: 109 U/L (ref 39–117)
BUN: 13 mg/dL (ref 6–23)
CO2: 34 meq/L — ABNORMAL HIGH (ref 19–32)
Calcium: 9.3 mg/dL (ref 8.4–10.5)
Chloride: 102 meq/L (ref 96–112)
Creatinine, Ser: 0.65 mg/dL (ref 0.40–1.20)
GFR: 89.26 mL/min (ref >60.00)
Glucose, Bld: 181 mg/dL — ABNORMAL HIGH (ref 70–99)
Potassium: 4.4 meq/L (ref 3.5–5.1)
Sodium: 144 meq/L (ref 135–145)
Total Bilirubin: 0.5 mg/dL (ref 0.2–1.2)
Total Protein: 6.4 g/dL (ref 6.0–8.3)

## 2024-02-09 LAB — LIPID PANEL
Cholesterol: 137 mg/dL (ref 0–200)
HDL: 39.2 mg/dL (ref >39.00)
LDL Cholesterol: 66 mg/dL (ref 0–99)
NonHDL: 97.75
Total CHOL/HDL Ratio: 3
Triglycerides: 158 mg/dL — ABNORMAL HIGH (ref 0.0–149.0)
VLDL: 31.6 mg/dL (ref 0.0–40.0)

## 2024-02-09 LAB — MICROALBUMIN / CREATININE URINE RATIO
Creatinine,U: 71.2 mg/dL
Microalb Creat Ratio: UNDETERMINED mg/g (ref 0.0–30.0)
Microalb, Ur: <0.7 mg/dL

## 2024-02-09 LAB — HEMOGLOBIN A1C: Hgb A1c MFr Bld: 7.7 % — ABNORMAL HIGH (ref 4.6–6.5)

## 2024-02-11 ENCOUNTER — Encounter: Payer: Self-pay | Admitting: Internal Medicine

## 2024-02-11 ENCOUNTER — Ambulatory Visit (INDEPENDENT_AMBULATORY_CARE_PROVIDER_SITE_OTHER): Admitting: Internal Medicine

## 2024-02-11 VITALS — BP 130/70 | HR 84 | Temp 98.1°F | Ht 62.0 in | Wt 160.0 lb

## 2024-02-11 DIAGNOSIS — E039 Hypothyroidism, unspecified: Secondary | ICD-10-CM

## 2024-02-11 DIAGNOSIS — E1159 Type 2 diabetes mellitus with other circulatory complications: Secondary | ICD-10-CM

## 2024-02-11 DIAGNOSIS — R0683 Snoring: Secondary | ICD-10-CM | POA: Diagnosis not present

## 2024-02-11 DIAGNOSIS — I2583 Coronary atherosclerosis due to lipid rich plaque: Secondary | ICD-10-CM | POA: Diagnosis not present

## 2024-02-11 DIAGNOSIS — Z7984 Long term (current) use of oral hypoglycemic drugs: Secondary | ICD-10-CM

## 2024-02-11 DIAGNOSIS — R400 Somnolence: Secondary | ICD-10-CM

## 2024-02-11 MED ORDER — SITAGLIPTIN PHOSPHATE 50 MG PO TABS: 50.0000 mg | ORAL_TABLET | Freq: Every day | ORAL | 1 refills | Status: DC

## 2024-02-11 MED ORDER — DULOXETINE HCL 30 MG PO CPEP: 30.0000 mg | ORAL_CAPSULE | Freq: Every day | ORAL | 3 refills | Status: DC

## 2024-02-11 MED ORDER — ATORVASTATIN CALCIUM 40 MG PO TABS: 40.0000 mg | ORAL_TABLET | Freq: Every day | ORAL | 3 refills | Status: DC

## 2024-02-11 NOTE — Patient Instructions (Addendum)
 I would like to add a new medication for your diabetes to take the place of metformin  ,  but your insurance is limiting me.  I am asking our clinical pharmacist, Virginia Floyd  to take a look at our options.    For now let's concentrate on diet and exercise and looking at your sugar patterns :  [name] now makes a frozen breakfast frittata that can be microwaved in 2 minutes and is very low carb. Frittatas are similar to quiches without the crust   [employer]egg[employer] Made Great ! Makes other  smaller  frittatas that are very tasty  Recommend trying these  Low Carb high Protein premixed Shakes:   Premier Protein  Atkins Advantage Muscle Milk EAS AdvantEdge  FairLife   All of these are available at [employer], [employer],  [employer], and [employer]  And taste good   Referral to [facility] for sleep study in process

## 2024-02-11 NOTE — Progress Notes (Unsigned)
 Subjective:  Patient ID: Virginia Floyd, female    DOB: 08/18/1955  Age: 68 y.o. MRN: [id number]  CC: The primary encounter diagnosis was Type 2 diabetes mellitus with other circulatory complication, without long-term current use of insulin (HCC). Diagnoses of Acquired hypothyroidism, Snoring, and Uncontrolled daytime somnolence were also pertinent to this visit.   HPI Virginia Floyd presents for  Chief Complaint  Patient presents with  . Medical Management of Chronic Issues    dm    Uncontrolled type 2 diabetes : seen one month ago for A1c  of 8.2  .  Advise to change diet,  CBG monitor placed but data not collected  .  Has noted CBG as hias 20 pos prandially, and fastings < 130 .  Taking glipizide  nad metformin ,  would like o sotop morhpne   Outpatient Medications Prior to Visit  Medication Sig Dispense Refill  . gabapentin  (NEURONTIN ) 400 MG capsule Take 1 capsule (400 mg total) by mouth at bedtime. 90 capsule 3  . glipiZIDE  (GLUCOTROL  XL) 10 MG 24 hr tablet Take 1 tablet (10 mg total) by mouth daily. 90 tablet 3  . levothyroxine  (SYNTHROID ) 88 MCG tablet Take 1 tablet (88 mcg total) by mouth daily. 90 tablet 0  . Melatonin 12 MG TABS 17 mg at night    . metFORMIN  (GLUCOPHAGE ) 1000 MG tablet Take 1 tablet (1,000 mg total) by mouth 2 (two) times daily with a meal. 180 tablet 3  . atorvastatin  (LIPITOR) 40 MG tablet TAKE 1 TABLET (40 MG TOTAL) BY MOUTH DAILY. 90 tablet 3  . DULoxetine  (CYMBALTA ) 30 MG capsule Take 1 capsule (30 mg total) by mouth daily. 90 capsule 3   No facility-administered medications prior to visit.    Review of Systems;  Patient denies headache, fevers, malaise, unintentional weight loss, skin rash, eye pain, sinus congestion and sinus pain, sore throat, dysphagia,  hemoptysis , cough, dyspnea, wheezing, chest pain, palpitations, orthopnea, edema, abdominal pain, nausea, melena, diarrhea, constipation, flank pain, dysuria, hematuria, urinary  Frequency, nocturia,  numbness, tingling, seizures,  Focal weakness, Loss of consciousness,  Tremor, insomnia, depression, anxiety, and suicidal ideation.      Objective:  BP 130/70   Pulse 84   Temp 98.1 F (36.7 C) (Oral)   Ht 5' 2 (1.575 m)   Wt 160 lb (72.6 kg)   SpO2 95%   BMI 29.26 kg/m   BP Readings from Last 3 Encounters:  02/11/24 130/70  01/07/24 132/66  11/30/23 126/70    Wt Readings from Last 3 Encounters:  02/11/24 160 lb (72.6 kg)  01/07/24 161 lb 3.2 oz (73.1 kg)  11/30/23 161 lb (73 kg)    Physical Exam Vitals reviewed.  Constitutional:      General: She is not in acute distress.    Appearance: Normal appearance. She is normal weight. She is not ill-appearing, toxic-appearing or diaphoretic.  HENT:     Head: Normocephalic.  Eyes:     General: No scleral icterus.       Right eye: No discharge.        Left eye: No discharge.     Conjunctiva/sclera: Conjunctivae normal.  Musculoskeletal:        General: Normal range of motion.  Skin:    General: Skin is warm and dry.  Neurological:     General: No focal deficit present.     Mental Status: She is alert and oriented to person, place, and time. Mental status is at baseline.  Psychiatric:  Mood and Affect: Mood normal.        Behavior: Behavior normal.        Thought Content: Thought content normal.        Judgment: Judgment normal.    Lab Results  Component Value Date   HGBA1C 7.7 (H) 02/09/2024   HGBA1C 8.2 (A) 11/08/2023   HGBA1C 6.8 (A) 10/15/2022    Lab Results  Component Value Date   CREATININE 0.65 02/09/2024   CREATININE 0.62 01/05/2024   CREATININE 0.68 06/10/2023    Lab Results  Component Value Date   WBC 10.3 05/16/2023   HGB 14.7 05/16/2023   HCT 43.9 05/16/2023   PLT 208 05/16/2023   GLUCOSE 181 (H) 02/09/2024   CHOL 137 02/09/2024   TRIG 158.0 (H) 02/09/2024   HDL 39.20 02/09/2024   LDLDIRECT 106.0 03/02/2022   LDLCALC 66 02/09/2024   ALT 27 02/09/2024   AST 18 02/09/2024   NA  144 02/09/2024   K 4.4 02/09/2024   CL 102 02/09/2024   CREATININE 0.65 02/09/2024   BUN 13 02/09/2024   CO2 34 (H) 02/09/2024   TSH 10.37 (H) 01/05/2024   HGBA1C 7.7 (H) 02/09/2024   MICROALBUR <0.7 02/09/2024    CT CORONARY MORPH W/CTA COR W/SCORE W/CA W/CM &/OR WO/CM Addendum Date: 07/06/2023 ADDENDUM REPORT: 07/06/2023 00:21 EXAM: OVER-READ INTERPRETATION  CT CHEST The following report is an over-read performed by radiologist Dr. Suzen Dials of [facility] on 07/06/2023. This over-read does not include interpretation of cardiac or coronary anatomy or pathology. The coronary calcium  score/coronary CTA interpretation by the cardiologist is attached. COMPARISON:  May 06, 2023 FINDINGS: Cardiovascular: There are no significant extracardiac vascular findings. Mediastinum/Nodes: There are no enlarged lymph nodes within the visualized mediastinum. Lungs/Pleura: There is no pleural effusion. A 3 mm calcified pulmonary nodule is seen within the posterolateral aspect of the right lung base. Upper abdomen: No significant findings in the visualized upper abdomen. Musculoskeletal/Chest wall: No chest wall mass or suspicious osseous findings within the visualized chest. IMPRESSION: No significant extracardiac findings within the visualized chest. Electronically Signed   By: Suzen Dials M.D.   On: 07/06/2023 00:21   Result Date: 07/06/2023 CLINICAL DATA:  68 yo female with chest pain EXAM: Cardiac/Coronary CTA TECHNIQUE: A non-contrast, gated CT scan was obtained with axial slices of 3 mm through the heart for calcium  scoring. Calcium  scoring was performed using the Agatston method. A 120 kV prospective, gated, contrast cardiac scan was obtained. Gantry rotation speed was 250 msecs and collimation was 0.6 mm. Two sublingual nitroglycerin  tablets (0.8 mg) were given. The 3D data set was reconstructed in 5% intervals of the 35-75% of the R-R cycle. Diastolic phases were analyzed on a  dedicated workstation using MPR, MIP, and VRT modes. The patient received 95 cc of contrast. FINDINGS: Image quality: Excellent. Noise artifact is: Limited. Coronary Arteries:  Normal coronary origin.  Right dominance. Left main: The left main is a large caliber vessel with a normal take off from the left coronary cusp that bifurcates to form a left anterior descending artery and a left circumflex artery. There is minimal (0-24) plaque in the distal vessel. Left anterior descending artery: The LAD has minimal (0-24) plaque in the proximal vessel followed by mild (25-49) in the mid vessel. The LAD gives off 1 diagonal branch with minimal (0-24) plaque in the mid vessel. Left circumflex artery: The LCX is non-dominant with minimal (0-24) plaque in the proximal vessel and mild (25-49) in the  mid vessel. The LCX gives off 3 patent obtuse marginal branch. Right coronary artery: The RCA is dominant with normal take off from the right coronary cusp. There is minimal (0-24) plaque in the proximal and mid vessel. The RCA terminates as a PDA and right posterolateral branch without evidence of plaque or stenosis. Right Atrium: Right atrial size is within normal limits. Right Ventricle: The right ventricular cavity is within normal limits. Left Atrium: Left atrial size is normal in size with no left atrial appendage filling defect. Left Ventricle: The ventricular cavity size is within normal limits. Pulmonary arteries: Normal in size. Pulmonary veins: Normal pulmonary venous drainage. Pericardium: Normal thickness without significant effusion or calcium  present. Cardiac valves: The aortic valve is trileaflet without significant calcification. The mitral valve is normal without significant calcification. Aorta: Normal caliber with aortic atherosclerosis. Extra-cardiac findings: See attached radiology report for non-cardiac structures. IMPRESSION: 1. Coronary calcium  score of 520. This was 28 percentile for age-, sex, and  race-matched controls. 2. Total plaque volume 435 mm3 which is 72 percentile for age- and sex-matched controls (calcified plaque 95 mm3; non-calcified plaque 340 mm3). TPV is (severe). 3. Normal coronary origin with right dominance. 4. Mild stenosis in the LAD and Lcx; otherwise minimal plaque. 5. Aortic atherosclerosis. 6. Study will be sent for FFR. RECOMMENDATIONS: CAD-RADS 2: Mild non-obstructive CAD (25-49%). Consider non-atherosclerotic causes of chest pain. Consider preventive therapy and risk factor modification. Redell Shallow, MD Electronically Signed: By: Redell Shallow M.D. On: 06/21/2023 13:54    Assessment & Plan:  .Type 2 diabetes mellitus with other circulatory complication, without long-term current use of insulin (HCC)  Acquired hypothyroidism Assessment & Plan: Thyroid  is underactive on 75 mcg daily.  Dose increased to 88 .  Repeat TSH in late November.   Lab Results  Component Value Date   TSH 10.37 (H) 01/05/2024     Orders: -     TSH; Future  Snoring -     Pulmonary Visit  Uncontrolled daytime somnolence -     Pulmonary Visit  Other orders -     Atorvastatin  Calcium ; Take 1 tablet (40 mg total) by mouth daily.  Dispense: 90 tablet; Refill: 3 -     DULoxetine  HCl; Take 1 capsule (30 mg total) by mouth daily.  Dispense: 90 capsule; Refill: 3     I spent 34 minutes on the day of this face to face encounter reviewing patient's  most recent visit with cardiology,  nephrology,  and neurology,  prior relevant surgical and non surgical procedures, recent  labs and imaging studies, counseling on weight management,  reviewing the assessment and plan with patient, and post visit ordering and reviewing of  diagnostics and therapeutics with patient  .   Follow-up: Return in about 4 weeks (around 03/10/2024) for follow up diabetes.   Verneita LITTIE Kettering, MD

## 2024-02-11 NOTE — Assessment & Plan Note (Signed)
 Thyroid  is underactive on 75 mcg daily.  Dose increased to 88 .  Repeat TSH in late November.   Lab Results  Component Value Date   TSH 10.37 (H) 01/05/2024

## 2024-02-13 DIAGNOSIS — R400 Somnolence: Secondary | ICD-10-CM | POA: Insufficient documentation

## 2024-02-13 DIAGNOSIS — R0683 Snoring: Secondary | ICD-10-CM | POA: Insufficient documentation

## 2024-02-13 NOTE — Assessment & Plan Note (Signed)
 She is asymptomatic  and is tolerating statin.

## 2024-02-13 NOTE — Assessment & Plan Note (Signed)
 Improved control with dietary modifications and  maximal doses of glipizide  and metformin . Her insurane will not cover Januvia or Jardance,  She has no microscopic proteinuria. CBG monitor applied today to evaluate glycemic patterns. . Reviewed role of  exercise and made recommendations regarding both   Lab Results  Component Value Date   HGBA1C 7.7 (H) 02/09/2024   Lab Results  Component Value Date   LABMICR See below: 02/04/2023   MICROALBUR <0.7 02/09/2024

## 2024-02-13 NOTE — Assessment & Plan Note (Signed)
 Referring to Dr Jess for rule out OSA

## 2024-02-15 ENCOUNTER — Ambulatory Visit (INDEPENDENT_AMBULATORY_CARE_PROVIDER_SITE_OTHER): Admitting: Sleep Medicine

## 2024-02-15 ENCOUNTER — Encounter: Payer: Self-pay | Admitting: Sleep Medicine

## 2024-02-15 VITALS — BP 128/68 | HR 72 | Temp 98.1°F | Ht 62.0 in | Wt 162.0 lb

## 2024-02-15 DIAGNOSIS — E119 Type 2 diabetes mellitus without complications: Secondary | ICD-10-CM | POA: Diagnosis not present

## 2024-02-15 DIAGNOSIS — F5104 Psychophysiologic insomnia: Secondary | ICD-10-CM | POA: Diagnosis not present

## 2024-02-15 DIAGNOSIS — G4733 Obstructive sleep apnea (adult) (pediatric): Secondary | ICD-10-CM

## 2024-02-15 MED ORDER — TRAZODONE HCL 50 MG PO TABS: 50.0000 mg | ORAL_TABLET | Freq: Every day | ORAL | 3 refills | Status: DC

## 2024-02-15 NOTE — Progress Notes (Signed)
 Name:Virginia Floyd MRN: [id number] DOB: [birth year]   CHIEF COMPLAINT:  EXCESSIVE DAYTIME SLEEPINESS   HISTORY OF PRESENT ILLNESS: Ms. Salameh is a 68 y.o. w/ a h/o DMII, hyperlipidemia and hypothyroidism who presents for c/o loud snoring and excessive daytime sleepiness which has been present for several years. Reports nocturnal awakenings due to unclear reasons and has difficulty falling back to sleep. Denies any significant weight changes. Admits to dry mouth, night sweats and morning headaches. Denies RLS symptoms, dream enactment, cataplexy, hypnagogic or hypnapompic hallucinations. Reports a family history of sleep apnea. Denies drowsy driving. Drinks 1 cup of coffee daily, occasional alcohol use, denies tobacco or illicit drug use.   Bedtime 11 pm Sleep onset varies Rise time 8-10 am   EPWORTH SLEEP SCORE 11    02/15/2024    8:55 AM  Results of the Epworth flowsheet  Sitting and reading 2  Watching TV 3  Sitting, inactive in a public place (e.g. a theatre or a meeting) 0  As a passenger in a car for an hour without a break 1  Lying down to rest in the afternoon when circumstances permit 3  Sitting and talking to someone 0  Sitting quietly after a lunch without alcohol 1  In a car, while stopped for a few minutes in traffic 1  Total score 11    PAST MEDICAL HISTORY :   has a past medical history of Allergy (2005), Arthritis (2022), Cervical radiculopathy, COPD (chronic obstructive pulmonary disease) (HCC) (2000), Coronary artery disease (05/16/23), Diabetes mellitus without complication (HCC), GERD (gastroesophageal reflux disease) (2005), Hodgkin's lymphoma (HCC) (2008), Hyperlipidemia, unspecified hyperlipidemia type, Hypertension (05/16/23), Hypothyroidism, Mood disorder, and OAB (overactive bladder).  has a past surgical history that includes Abdominal hysterectomy (2000); Cholecystectomy (2008); Trigger finger release; LASIK; Liposuction; Cardiac catheterization  (2016); and Eye surgery (2000). Prior to Admission medications   Medication Sig Start Date End Date Taking? Authorizing Provider  atorvastatin  (LIPITOR) 40 MG tablet Take 1 tablet (40 mg total) by mouth daily. 02/11/24  Yes Marylynn Verneita CROME, MD  DULoxetine  (CYMBALTA ) 30 MG capsule Take 1 capsule (30 mg total) by mouth daily. 02/11/24  Yes Marylynn Verneita CROME, MD  gabapentin  (NEURONTIN ) 400 MG capsule Take 1 capsule (400 mg total) by mouth at bedtime. 11/08/23  Yes Jimmy Charlie FERNS, MD  glipiZIDE  (GLUCOTROL  XL) 10 MG 24 hr tablet Take 1 tablet (10 mg total) by mouth daily. 05/10/23  Yes Jimmy Charlie FERNS, MD  levothyroxine  (SYNTHROID ) 88 MCG tablet Take 1 tablet (88 mcg total) by mouth daily. 01/07/24  Yes Marylynn Verneita CROME, MD  Melatonin 12 MG TABS 17 mg at night 01/07/24  Yes Marylynn Verneita CROME, MD   Allergies  Allergen Reactions   Latex Swelling   Codeine Hives, Itching and Nausea And Vomiting   Sulfa Antibiotics Hives, Itching and Nausea And Vomiting    FAMILY HISTORY:  family history includes Alcohol abuse in her father; Arthritis in her mother; Bladder Cancer in her brother; COPD in her brother; Cancer in her brother and father; Diabetes in her sister and sister; Early death in her mother; Heart attack in her maternal grandmother and paternal grandfather; Heart disease in her sister; Heart failure in her sister; Hodgkin's lymphoma in her brother; Lupus in her mother; Miscarriages / Stillbirths in her daughter. SOCIAL HISTORY:  reports that she quit smoking about 10 years ago. Her smoking use included cigarettes. She started smoking about 53 years ago. She has a 32.3 pack-year smoking  history. She has been exposed to tobacco smoke. She has never used smokeless tobacco. She reports that she does not currently use alcohol. She reports that she does not currently use drugs.   Review of Systems:  Gen:  Denies  fever, sweats, chills weight loss  HEENT: Denies blurred vision, double vision, ear pain, eye  pain, hearing loss, nose bleeds, sore throat Cardiac:  No dizziness, chest pain or heaviness, chest tightness,edema, No JVD Resp:   No cough, -sputum production, -shortness of breath,-wheezing, -hemoptysis,  Gi: Denies swallowing difficulty, stomach pain, nausea or vomiting, diarrhea, constipation, bowel incontinence Gu:  Denies bladder incontinence, burning urine Ext:   Denies Joint pain, stiffness or swelling Skin: Denies  skin rash, easy bruising or bleeding or hives Endoc:  Denies polyuria, polydipsia , polyphagia or weight change Psych:   Denies depression, insomnia or hallucinations  Other:  All other systems negative  VITAL SIGNS: BP 128/68   Pulse 72   Temp 98.1 F (36.7 C)   Ht 5' 2 (1.575 m)   Wt 162 lb (73.5 kg)   SpO2 97%   BMI 29.63 kg/m    Physical Examination:   General Appearance: No distress  EYES PERRLA, EOM intact.   NECK Supple, No JVD Pulmonary: normal breath sounds, No wheezing.  CardiovascularNormal S1,S2.  No m/r/g.   Abdomen: Benign, Soft, non-tender. Skin:   warm, no rashes, no ecchymosis  Extremities: normal, no cyanosis, clubbing. Neuro:without focal findings,  speech normal  PSYCHIATRIC: Mood, affect within normal limits.   ASSESSMENT AND PLAN  OSA I suspect that OSA is likely present due to clinical presentation. Discussed the consequences of untreated sleep apnea. Advised not to drive drowsy for safety of patient and others. Will complete further evaluation with a home sleep study and follow up to review results.    DMII Stable, on current management. Following with PCP.   Insomnia Counseled patient on stimulus control and improving sleep hygiene practices.    MEDICATION ADJUSTMENTS/LABS AND TESTS ORDERED: Recommend Sleep Study   Patient  satisfied with Plan of action and management. All questions answered  Follow up to review HST results and treatment plan.   I spent a total of 48 minutes reviewing chart data, face-to-face  evaluation with the patient, counseling and coordination of care as detailed above.    Darthula Desa, M.D.  Sleep Medicine [facility]

## 2024-02-15 NOTE — Patient Instructions (Signed)
 SABRA

## 2024-02-18 ENCOUNTER — Telehealth: Payer: Self-pay

## 2024-02-18 NOTE — Progress Notes (Signed)
 Complex Care Management Note Care Guide Note  02/18/2024 Name: Virginia Floyd MRN: [id number] DOB: Nov 04, 1955   Complex Care Management Outreach Attempts: An unsuccessful telephone outreach was attempted today to offer the patient information about available complex care management services.  Follow Up Plan:  Additional outreach attempts will be made to offer the patient complex care management information and services.   Encounter Outcome:  No Answer  Dreama [facility]  [facility], [facility] VBCI Assistant Direct Dial: [phone]  Fax: [phone]

## 2024-02-23 ENCOUNTER — Encounter: Payer: Self-pay | Admitting: Sleep Medicine

## 2024-02-23 NOTE — Progress Notes (Signed)
 Complex Care Management Note  Care Guide Note 02/23/2024 Name: Virginia Floyd MRN: [id number] DOB: 1955/09/07  Virginia Floyd is a 68 y.o. year old female who sees Marylynn, Verneita CROME, MD for primary care. I reached out to Rock Moritz by phone today to offer complex care management services.  [name] was given information about Complex Care Management services today including:   The Complex Care Management services include support from the care team which includes your Nurse Care Manager, Clinical Social Worker, or Pharmacist.  The Complex Care Management team is here to help remove barriers to the health concerns and goals most important to you. Complex Care Management services are voluntary, and the patient may decline or stop services at any time by request to their care team member.   Complex Care Management Consent Status: Patient agreed to services and verbal consent obtained.   Follow up plan:  Telephone appointment with complex care management team member scheduled for:  02/28/24 at 11:00 a.m.   Encounter Outcome:  Patient Scheduled  Dreama [facility]wood [facility]  [facility], [facility] VBCI Assistant Direct Dial: [phone]  Fax: [phone]

## 2024-02-23 NOTE — Progress Notes (Signed)
 Complex Care Management Note Care Guide Note  02/23/2024 Name: Virginia Floyd MRN: [id number] DOB: [birth year]   Complex Care Management Outreach Attempts: A second unsuccessful outreach was attempted today to offer the patient with information about available complex care management services.  Follow Up Plan:  Additional outreach attempts will be made to offer the patient complex care management information and services.   Encounter Outcome:  Patient Request to Call Back  Dreama [facility]  [facility], [facility] VBCI Assistant Direct Dial: [phone]  Fax: [phone]

## 2024-02-24 ENCOUNTER — Ambulatory Visit: Admitting: Sleep Medicine

## 2024-02-25 ENCOUNTER — Other Ambulatory Visit (INDEPENDENT_AMBULATORY_CARE_PROVIDER_SITE_OTHER)

## 2024-02-25 DIAGNOSIS — E039 Hypothyroidism, unspecified: Secondary | ICD-10-CM

## 2024-02-25 LAB — TSH: TSH: 2.13 u[IU]/mL (ref 0.35–5.50)

## 2024-02-27 ENCOUNTER — Ambulatory Visit: Payer: Self-pay | Admitting: Internal Medicine

## 2024-02-28 ENCOUNTER — Other Ambulatory Visit: Admitting: Pharmacist

## 2024-02-28 DIAGNOSIS — E1159 Type 2 diabetes mellitus with other circulatory complications: Secondary | ICD-10-CM

## 2024-02-28 NOTE — Progress Notes (Signed)
   02/28/2024 Name: Virginia Floyd MRN: [id number] DOB: Mar 31, 1956  Subjective  Chief Complaint  Patient presents with   Diabetes   Medication Access    Care Team: Primary Care Provider: Marylynn Verneita CROME, MD  Reason for visit: ?  Syrena Burges is a 68 y.o. female who presents today for a telephone visit with the pharmacist due to medication access concerns regarding brand-name diabetes medications. ?   Medication Access: ?  Reports that her insurance plan has not covered Januvia  or Jardance. With this, she has continue to take metformin  and glipizide . Since last PCP visit, she has been communicating with a [employer] agent to explore plans that offer better prescription coverage.    Prescription drug coverage: YES (though has signed up for new plan for 2026)  Payor: MEDICARE / Plan: MEDICARE PART A AND B / Product Type: *No Product type* / .  Summary of Benefit: [employer] MED[employer] Insurance agent told her Januvia  would be $118 jan, $118 Feb, then down to $47/month thereafter for 2026 which she feels is affordable.   Current Patient Assistance: Just over 400% [employer]. Household of 1 with income via SSI + pension + late spouse's pension.   Medicare LIS Eligible: No   Other Increasing concerns for urinary incontinence, plans to discuss with PCP if not improving. Some incontinence at night though during waking hours is typically consistent w Stress incontinence (small volume 2/2 specific movements/actions).  In the past, consistently used some type of electrical stimulator/TENS device which did provide benefit. Still has this and will start using again.   Assessment and Plan:   1. Medication Access Reviewed patient's insurance plan for next year including generally how drug deductible/copay system works. Reviewed information provided to her by her [employer] agent. Provided patient with direct line for questions once she determines which exact advantage plan she signed up for so we can confirm drug  benefits.  At this time, she feels that Januvia  or other brand med will be affordable for her through her new Medicare Advantage plan in 2026. Estimated income is above limit for patient assistance. In the future may qualify for other programs for other indications (such as grants through the [employer])   2. Incontinence Encouraged patient to discuss with PCP if lifestyle changes do not sufficiently help over time.  Prior benefit with TENS unit, patient goal to resume regular use.  Discussed pelvic floor exercises/online resources/tutorials    Follow up: Patient plans to determine which plan she signed up for and will follow up with pharmacist to review prescription benefits in full detail.   Future Appointments  Date Time Provider Department Center  03/09/2024 12:30 PM Marylynn Verneita CROME, MD [facility] [id number] FABIENE Geronimo, PharmD Clinical Pharmacist [facility] [phone]

## 2024-02-28 NOTE — Patient Instructions (Signed)
 Ms. Virginia Floyd,   It was a pleasure to speak with you today! As we discussed:?   Your insurance agent confirmed your cost for brand-name medicines will be $47 per month after you spend a certain amount of month at the pharmacy at the start of the year (this amount is called your prescription deductible).  Each [facility] Medicare Advantage plan has a specific name or a specific plan number, for example: [employer] Choice (PPO). Plan number:  PPO [id number] Once you know your plan name or the plan number, we will be able to pull up your plan's summary of benefits for 2026. I would be happy to review the insurance benefits with you to make sure everything is clear.   Regarding urinary symptoms: You received benefit previously from the electrical stimulation unit. It is reasonable to add this back to your routine to see if you notice any benefit over time.   I also encourage you to look into Pelvig floor exercises such as keigels. These exercises help to strengthen the muscles supporting the bladder, if done regularly can significantly improve symptoms in many women.  And of course, if those things together are helping but not providing quite enough benefit, continue this discussion at your follow up with Dr. Marylynn. There are certainly other options including medications down the line as needed.   Please feel free to reach out should you have any questions regarding you insurance plan or medication costs moving forward.  You may reach me via MyChart Message, or you may leave me a voicemail at [phone] and I will get back to you shortly.   Thank you!   Future Appointments  Date Time Provider Department Center  03/09/2024 12:30 PM Marylynn Verneita CROME, MD [id number] [id number] FABIENE Geronimo, PharmD Clinical Pharmacist [facility] [phone]

## 2024-02-29 ENCOUNTER — Encounter: Payer: Self-pay | Admitting: Pharmacist

## 2024-02-29 NOTE — Progress Notes (Signed)
 Chart Review Reason: New insurance coverage  Summary: Patient notes she received her plan details for her 2026 Medicare plan. Sent to PharmD for review of drug benefits.   Summary of Benefit 2026: [email or web address]    Breakdown of your pharmacy coverage for 2026:  Generic medications All generic medications that are preferred will be $0-5 each month which will be most of your medications.  The deductible does not apply to generic medications (meaning starting Jan1, those generics should be $0-5 right away).  Pharmacy Deductible (brand meds) The deductible only applies to brand medications (Tier  and 4).  Your pharmacy deductible is $250 which is very low compared to other Medicare plans.  After you have spent a total of $250 on all meds in 2026, the price for brand medications will drop to $47 each month for the rest of the year.   Discussed with patient via MyChart.  Direct line given to patient for further questions.

## 2024-03-01 ENCOUNTER — Other Ambulatory Visit: Payer: Self-pay | Admitting: Internal Medicine

## 2024-03-02 ENCOUNTER — Encounter

## 2024-03-02 DIAGNOSIS — G4733 Obstructive sleep apnea (adult) (pediatric): Secondary | ICD-10-CM

## 2024-03-05 ENCOUNTER — Encounter: Payer: Self-pay | Admitting: Internal Medicine

## 2024-03-05 DIAGNOSIS — E1159 Type 2 diabetes mellitus with other circulatory complications: Secondary | ICD-10-CM

## 2024-03-05 DIAGNOSIS — R400 Somnolence: Secondary | ICD-10-CM

## 2024-03-06 ENCOUNTER — Other Ambulatory Visit: Payer: Self-pay

## 2024-03-06 DIAGNOSIS — F5104 Psychophysiologic insomnia: Secondary | ICD-10-CM

## 2024-03-06 MED ORDER — TRAZODONE HCL 50 MG PO TABS: 50.0000 mg | ORAL_TABLET | Freq: Every day | ORAL | 3 refills | Status: DC

## 2024-03-06 NOTE — Telephone Encounter (Signed)
 I have printed the [place] data and placed in yellow results folder.

## 2024-03-06 NOTE — Assessment & Plan Note (Signed)
 Sleep study done in mid November.  Results pending

## 2024-03-09 ENCOUNTER — Ambulatory Visit: Admitting: Internal Medicine

## 2024-03-09 ENCOUNTER — Encounter: Payer: Self-pay | Admitting: Internal Medicine

## 2024-03-09 VITALS — BP 134/64 | HR 87 | Ht 62.0 in | Wt 163.8 lb

## 2024-03-09 DIAGNOSIS — Z7984 Long term (current) use of oral hypoglycemic drugs: Secondary | ICD-10-CM

## 2024-03-09 DIAGNOSIS — N3946 Mixed incontinence: Secondary | ICD-10-CM | POA: Diagnosis not present

## 2024-03-09 DIAGNOSIS — E785 Hyperlipidemia, unspecified: Secondary | ICD-10-CM

## 2024-03-09 DIAGNOSIS — C8108 Nodular lymphocyte predominant Hodgkin lymphoma, lymph nodes of multiple sites: Secondary | ICD-10-CM | POA: Diagnosis not present

## 2024-03-09 DIAGNOSIS — I2583 Coronary atherosclerosis due to lipid rich plaque: Secondary | ICD-10-CM

## 2024-03-09 DIAGNOSIS — E1159 Type 2 diabetes mellitus with other circulatory complications: Secondary | ICD-10-CM | POA: Diagnosis not present

## 2024-03-09 DIAGNOSIS — R0683 Snoring: Secondary | ICD-10-CM | POA: Diagnosis not present

## 2024-03-09 DIAGNOSIS — G4733 Obstructive sleep apnea (adult) (pediatric): Secondary | ICD-10-CM | POA: Diagnosis not present

## 2024-03-09 DIAGNOSIS — N3281 Overactive bladder: Secondary | ICD-10-CM

## 2024-03-09 MED ORDER — SOLIFENACIN SUCCINATE 5 MG PO TABS: 5.0000 mg | ORAL_TABLET | Freq: Every day | ORAL | 2 refills | Status: DC

## 2024-03-09 MED ORDER — GLIPIZIDE 10 MG PO TABS: 10.0000 mg | ORAL_TABLET | Freq: Two times a day (BID) | ORAL | 3 refills | Status: DC

## 2024-03-09 MED ORDER — SITAGLIPTIN PHOSPHATE 25 MG PO TABS: 25.0000 mg | ORAL_TABLET | Freq: Every day | ORAL | 0 refills | Status: DC

## 2024-03-09 MED ORDER — RYBELSUS 7 MG PO TABS: 7.0000 mg | ORAL_TABLET | Freq: Every day | ORAL | Status: DC

## 2024-03-09 NOTE — Patient Instructions (Addendum)
 SUSPEND THE METFORMIN  FOR NOW   TRIAL OF RYBELSUS ONCE DAILY ON AN EMPTY STOMACH  CHANGING GLIPIZIDE  TO TWICE DAILY OF THE IMMEDIATE RELEASE  ONCE YOU FINISH YOUR CURRENT SUPPLY OF GLIPIZIDE  XL.  TAKE EACH DOSE RIGHT BEFORE BREAKFAST AND DINNER   TRIAL OF GENERIC VESICARE FOR OVERACTIVE BLADDER/URINARY INCONTINENCE   URINALYSIS TODAY TO RULE OUT INFECTION AS A CAUSE OF YOUR SYMPTOMS

## 2024-03-09 NOTE — Progress Notes (Signed)
 Subjective:  Patient ID: Virginia Floyd, female    DOB: [birth year]  Age: 68 y.o. MRN: [id number]  CC: The primary encounter diagnosis was Mixed stress and urge urinary incontinence. 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   HPI Virginia Floyd presents for  Chief Complaint  Patient presents with   Medical Management of Chronic Issues    1) follow  up on type 2 DM;  PATIENT was given a Freestyle Libre monitor to wear from Nov 8 to Nov 21 .  I have downloaded and reviewed the data .   Patient's  sugars have been  IN RANGE 55    % OF THE TIME,   ABOVE RANGE  45  % of the time.  Average blood glucose is  180    with a variability of     23%  .  Patient's medications currently used for glycemic control are glucotrol  XL 10  mg daily and metformin  1000 mg twice daily .  Patient reports that she has only been using metformin  once daily .     2) she reports INCREASED URINARY INCONTINENCE  NOW OCCURRING AS A LEAK DURING SNEEZE OR COUGH    Outpatient Medications Prior to Visit  Medication Sig Dispense Refill   atorvastatin  (LIPITOR) 40 MG tablet Take 1 tablet (40 mg total) by mouth daily. 90 tablet 3   DULoxetine  (CYMBALTA ) 30 MG capsule Take 1 capsule (30 mg total) by mouth daily. 90 capsule 3   gabapentin  (NEURONTIN ) 400 MG capsule Take 1 capsule (400 mg total) by mouth at bedtime. 90 capsule 3   levothyroxine  (SYNTHROID ) 88 MCG tablet TAKE 1 TABLET EVERY DAY 90 tablet 3   Melatonin 12 MG TABS 17 mg at night     metFORMIN  (GLUCOPHAGE ) 500 MG tablet Take 1,000 mg by mouth 2 (two) times daily with a meal.     traZODone  (DESYREL ) 50 MG tablet Take 1 tablet (50  mg total) by mouth at bedtime. 30 tablet 3   glipiZIDE  (GLUCOTROL  XL) 10 MG 24 hr tablet Take 1 tablet (10 mg total) by mouth daily. 90 tablet 3   No facility-administered medications prior to visit.    Review of Systems;  Patient denies headache, fevers, malaise, unintentional weight loss, skin rash, eye pain, sinus congestion and sinus pain, sore throat, dysphagia,  hemoptysis , cough, dyspnea, wheezing, chest pain, palpitations, orthopnea, edema, abdominal pain, nausea, melena, diarrhea, constipation, flank pain, dysuria, hematuria, urinary  Frequency, nocturia, numbness, tingling, seizures,  Focal weakness, Loss of consciousness,  Tremor, insomnia, depression, anxiety, and suicidal ideation.      Objective:  BP 134/64   Pulse 87   Ht 5' 2 (1.575 m)   Wt 163 lb 12.8 oz (74.3 kg)   SpO2 96%   BMI 29.96 kg/m   BP Readings from Last 3 Encounters:  03/09/24 134/64  02/15/24 128/68  02/11/24 130/70    Wt Readings from Last 3 Encounters:  03/09/24 163 lb 12.8 oz (74.3 kg)  02/15/24 162 lb (73.5 kg)  02/11/24 160 lb (72.6 kg)    Physical Exam Vitals reviewed.  Constitutional:      General: She is not in acute distress.    Appearance: Normal appearance. She  is normal weight. She is not ill-appearing, toxic-appearing or diaphoretic.  HENT:     Head: Normocephalic.  Eyes:     General: No scleral icterus.       Right eye: No discharge.        Left eye: No discharge.     Conjunctiva/sclera: Conjunctivae normal.  Cardiovascular:     Rate and Rhythm: Normal rate and regular rhythm.     Heart sounds: Normal heart sounds.  Pulmonary:     Effort: Pulmonary effort is normal. No respiratory distress.     Breath sounds: Normal breath sounds.  Musculoskeletal:        General: Normal range of motion.  Skin:    General: Skin is warm and dry.  Neurological:     General: No focal deficit present.     Mental Status: She is alert and oriented to person, place, and time. Mental  status is at baseline.  Psychiatric:        Mood and Affect: Mood normal.        Behavior: Behavior normal.        Thought Content: Thought content normal.        Judgment: Judgment normal.     Lab Results  Component Value Date   HGBA1C 7.7 (H) 02/09/2024   HGBA1C 8.2 (A) 11/08/2023   HGBA1C 6.8 (A) 10/15/2022    Lab Results  Component Value Date   CREATININE 0.65 02/09/2024   CREATININE 0.62 01/05/2024   CREATININE 0.68 06/10/2023    Lab Results  Component Value Date   WBC 10.3 05/16/2023   HGB 14.7 05/16/2023   HCT 43.9 05/16/2023   PLT 208 05/16/2023   GLUCOSE 181 (H) 02/09/2024   CHOL 137 02/09/2024   TRIG 158.0 (H) 02/09/2024   HDL 39.20 02/09/2024   LDLDIRECT 106.0 03/02/2022   LDLCALC 66 02/09/2024   ALT 27 02/09/2024   AST 18 02/09/2024   NA 144 02/09/2024   K 4.4 02/09/2024   CL 102 02/09/2024   CREATININE 0.65 02/09/2024   BUN 13 02/09/2024   CO2 34 (H) 02/09/2024   TSH 2.13 02/25/2024   HGBA1C 7.7 (H) 02/09/2024   MICROALBUR <0.7 02/09/2024    CT CORONARY MORPH W/CTA COR W/SCORE W/CA W/CM &/OR WO/CM Addendum Date: 07/06/2023 ADDENDUM REPORT: 07/06/2023 00:21 EXAM: OVER-READ INTERPRETATION  CT CHEST The following report is an over-read performed by radiologist Dr. Suzen Dials of [facility] on 07/06/2023. This over-read does not include interpretation of cardiac or coronary anatomy or pathology. The coronary calcium  score/coronary CTA interpretation by the cardiologist is attached. COMPARISON:  May 06, 2023 FINDINGS: Cardiovascular: There are no significant extracardiac vascular findings. Mediastinum/Nodes: There are no enlarged lymph nodes within the visualized mediastinum. Lungs/Pleura: There is no pleural effusion. A 3 mm calcified pulmonary nodule is seen within the posterolateral aspect of the right lung base. Upper abdomen: No significant findings in the visualized upper abdomen. Musculoskeletal/Chest wall: No chest wall mass or  suspicious osseous findings within the visualized chest. IMPRESSION: No significant extracardiac findings within the visualized chest. Electronically Signed   By: Suzen Dials M.D.   On: 07/06/2023 00:21   Result Date: 07/06/2023 CLINICAL DATA:  68 yo female with chest pain EXAM: Cardiac/Coronary CTA TECHNIQUE: A non-contrast, gated CT scan was obtained with axial slices of 3 mm through the heart for calcium  scoring. Calcium  scoring was performed using the Agatston method. A 120 kV prospective, gated, contrast cardiac scan was obtained. Gantry rotation speed was 250  msecs and collimation was 0.6 mm. Two sublingual nitroglycerin  tablets (0.8 mg) were given. The 3D data set was reconstructed in 5% intervals of the 35-75% of the R-R cycle. Diastolic phases were analyzed on a dedicated workstation using MPR, MIP, and VRT modes. The patient received 95 cc of contrast. FINDINGS: Image quality: Excellent. Noise artifact is: Limited. Coronary Arteries:  Normal coronary origin.  Right dominance. Left main: The left main is a large caliber vessel with a normal take off from the left coronary cusp that bifurcates to form a left anterior descending artery and a left circumflex artery. There is minimal (0-24) plaque in the distal vessel. Left anterior descending artery: The LAD has minimal (0-24) plaque in the proximal vessel followed by mild (25-49) in the mid vessel. The LAD gives off 1 diagonal branch with minimal (0-24) plaque in the mid vessel. Left circumflex artery: The LCX is non-dominant with minimal (0-24) plaque in the proximal vessel and mild (25-49) in the mid vessel. The LCX gives off 3 patent obtuse marginal branch. Right coronary artery: The RCA is dominant with normal take off from the right coronary cusp. There is minimal (0-24) plaque in the proximal and mid vessel. The RCA terminates as a PDA and right posterolateral branch without evidence of plaque or stenosis. Right Atrium: Right atrial size is  within normal limits. Right Ventricle: The right ventricular cavity is within normal limits. Left Atrium: Left atrial size is normal in size with no left atrial appendage filling defect. Left Ventricle: The ventricular cavity size is within normal limits. Pulmonary arteries: Normal in size. Pulmonary veins: Normal pulmonary venous drainage. Pericardium: Normal thickness without significant effusion or calcium  present. Cardiac valves: The aortic valve is trileaflet without significant calcification. The mitral valve is normal without significant calcification. Aorta: Normal caliber with aortic atherosclerosis. Extra-cardiac findings: See attached radiology report for non-cardiac structures. IMPRESSION: 1. Coronary calcium  score of 520. This was 21 percentile for age-, sex, and race-matched controls. 2. Total plaque volume 435 mm3 which is 72 percentile for age- and sex-matched controls (calcified plaque 95 mm3; non-calcified plaque 340 mm3). TPV is (severe). 3. Normal coronary origin with right dominance. 4. Mild stenosis in the LAD and Lcx; otherwise minimal plaque. 5. Aortic atherosclerosis. 6. Study will be sent for FFR. RECOMMENDATIONS: CAD-RADS 2: Mild non-obstructive CAD (25-49%). Consider non-atherosclerotic causes of chest pain. Consider preventive therapy and risk factor modification. Redell Shallow, MD Electronically Signed: By: Redell Shallow M.D. On: 06/21/2023 13:54    Assessment & Plan:  .Mixed stress and urge urinary incontinence -     Urinalysis, Routine w reflex microscopic; Future -     Urine Culture; Future  Type 2 diabetes mellitus with other circulatory complication, without long-term current use of insulin (HCC) Assessment & Plan: Adding januvia  to glipizde xL and metformin  . 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 Will avoid SGLT 2 inhibitors as therapy for DM given urinary symptoms    OSA (obstructive sleep apnea) Assessment & Plan: Severe,  with nocturnal desaturations by recent SNAP overnight study  ordered by DR Jess   Coronary atherosclerosis due to lipid rich plaque Assessment & Plan: CAC score >500 March 2025.  Continue atorvastatin   Lab Results  Component Value Date   CHOL 137 02/09/2024   HDL 39.20 02/09/2024   LDLCALC 66 02/09/2024   LDLDIRECT 106.0 03/02/2022   TRIG 158.0 (H) 02/09/2024   CHOLHDL 3 02/09/2024      Hyperlipidemia LDL goal <70 Assessment & Plan: Continue atorvastatin  40 mg.  LDL < 70  Lab Results  Component Value Date   CHOL 137 02/09/2024   HDL 39.20 02/09/2024   LDLCALC 66 02/09/2024   LDLDIRECT 106.0 03/02/2022   TRIG 158.0 (H) 02/09/2024   CHOLHDL 3 02/09/2024      Other orders -     SITagliptin  Phosphate; Take 1 tablet (25 mg total) by mouth daily.  Dispense: 30 tablet; Refill: 0 -     Solifenacin  Succinate; Take 1 tablet (5 mg total) by mouth daily.  Dispense: 30 tablet; Refill: 2 -     Rybelsus ; Take 1 tablet (7 mg total) by mouth daily. -     glipiZIDE ; Take 1 tablet (10 mg total) by mouth 2 (two) times daily before a meal.  Dispense: 180 tablet; Refill: 3    I personally spent a total of 35 minutes in the care of the patient today including preparing to see the patient, getting/reviewing separately obtained history, performing a medically appropriate exam/evaluation, counseling and educating, placing orders, documenting clinical information in the EHR, and independently interpreting results.  Follow-up: Return in about 2 months (around 05/16/2024) for follow up diabetes.   Verneita LITTIE Kettering, MD

## 2024-03-10 ENCOUNTER — Telehealth: Payer: Self-pay

## 2024-03-10 ENCOUNTER — Encounter: Payer: Self-pay | Admitting: Internal Medicine

## 2024-03-10 NOTE — Telephone Encounter (Signed)
 Copied from CRM [id number]. Topic: Clinical - Lab/Test Results >> Mar 10, 2024 10:04 AM Joesph PARAS wrote: Reason for CRM: Patient is calling to request provider advisement based on sleep study results that went to her MyChart yesterday. Please call patient with next steps.

## 2024-03-11 DIAGNOSIS — G4733 Obstructive sleep apnea (adult) (pediatric): Secondary | ICD-10-CM | POA: Insufficient documentation

## 2024-03-11 NOTE — Assessment & Plan Note (Signed)
 Adding januvia  to glipizde xL and metformin  . Also given Samples of Rybelsus  7 mg , will start wit 1/2 tablet daily for first week

## 2024-03-11 NOTE — Assessment & Plan Note (Signed)
 Continue atorvastatin  40 mg.  LDL < 70  Lab Results  Component Value Date   CHOL 137 02/09/2024   HDL 39.20 02/09/2024   LDLCALC 66 02/09/2024   LDLDIRECT 106.0 03/02/2022   TRIG 158.0 (H) 02/09/2024   CHOLHDL 3 02/09/2024

## 2024-03-11 NOTE — Assessment & Plan Note (Signed)
 Trial of vesicare . Will avoid SGLT 2 inhibitors as therapy for DM given urinary symptoms

## 2024-03-11 NOTE — Assessment & Plan Note (Signed)
 Severe,  with nocturnal desaturations by recent SNAP overnight study  ordered by DR Jess

## 2024-03-11 NOTE — Assessment & Plan Note (Signed)
 CAC score >500 March 2025.  Continue atorvastatin   Lab Results  Component Value Date   CHOL 137 02/09/2024   HDL 39.20 02/09/2024   LDLCALC 66 02/09/2024   LDLDIRECT 106.0 03/02/2022   TRIG 158.0 (H) 02/09/2024   CHOLHDL 3 02/09/2024

## 2024-03-11 NOTE — Assessment & Plan Note (Signed)
 No signs of recurrence by March 2025 non contrasted CT chest

## 2024-03-13 ENCOUNTER — Encounter: Payer: Self-pay | Admitting: Sleep Medicine

## 2024-03-13 ENCOUNTER — Other Ambulatory Visit (INDEPENDENT_AMBULATORY_CARE_PROVIDER_SITE_OTHER)

## 2024-03-13 ENCOUNTER — Ambulatory Visit: Payer: Self-pay

## 2024-03-13 ENCOUNTER — Ambulatory Visit: Admitting: Sleep Medicine

## 2024-03-13 VITALS — BP 120/80 | HR 86 | Temp 98.2°F | Ht 62.0 in | Wt 162.6 lb

## 2024-03-13 DIAGNOSIS — G4733 Obstructive sleep apnea (adult) (pediatric): Secondary | ICD-10-CM

## 2024-03-13 DIAGNOSIS — N3946 Mixed incontinence: Secondary | ICD-10-CM | POA: Diagnosis not present

## 2024-03-13 DIAGNOSIS — E119 Type 2 diabetes mellitus without complications: Secondary | ICD-10-CM

## 2024-03-13 DIAGNOSIS — Z87891 Personal history of nicotine dependence: Secondary | ICD-10-CM

## 2024-03-13 LAB — URINALYSIS, ROUTINE W REFLEX MICROSCOPIC
Bilirubin Urine: NEGATIVE
Hgb urine dipstick: NEGATIVE
Ketones, ur: NEGATIVE
Leukocytes,Ua: NEGATIVE
Nitrite: NEGATIVE
Specific Gravity, Urine: >=1.03 — AB (ref 1.000–1.030)
Total Protein, Urine: NEGATIVE
Urine Glucose: NEGATIVE
Urobilinogen, UA: 0.2 (ref 0.0–1.0)
pH: 5.5 (ref 5.0–8.0)

## 2024-03-13 NOTE — Addendum Note (Signed)
 Addended by: TANDA HARVEY D on: 03/13/2024 09:42 AM   Modules accepted: Orders

## 2024-03-13 NOTE — Progress Notes (Signed)
 Name:Virginia Floyd MRN: [id number] DOB: 03/13/1956   CHIEF COMPLAINT:  HST F/U   HISTORY OF PRESENT ILLNESS: Virginia Floyd is a 68 y.o. w/ a h/o DMII, hyperlipidemia and hypothyroidism who presents to follow up on HST results. The patient underwent HST which revealed severe OSA (AHI 32, O2 nadir 73%).    EPWORTH SLEEP SCORE 11    02/15/2024    8:55 AM  Results of the Epworth flowsheet  Sitting and reading 2  Watching TV 3  Sitting, inactive in a public place (e.g. a theatre or a meeting) 0  As a passenger in a car for an hour without a break 1  Lying down to rest in the afternoon when circumstances permit 3  Sitting and talking to someone 0  Sitting quietly after a lunch without alcohol 1  In a car, while stopped for a few minutes in traffic 1  Total score 11    PAST MEDICAL HISTORY :   has a past medical history of Allergy (2005), Arthritis (2022), Cervical radiculopathy, COPD (chronic obstructive pulmonary disease) (HCC) (2000), Coronary artery disease (05/16/23), Diabetes mellitus without complication (HCC), GERD (gastroesophageal reflux disease) (2005), Hodgkin's lymphoma (HCC) (2008), Hyperlipidemia, unspecified hyperlipidemia type, Hypertension (05/16/23), Hypothyroidism, Mood disorder, and OAB (overactive bladder).  has a past surgical history that includes Abdominal hysterectomy (2000); Cholecystectomy (2008); Trigger finger release; LASIK; Liposuction; Cardiac catheterization (2016); and Eye surgery (2000). Prior to Admission medications   Medication Sig Start Date End Date Taking? Authorizing Provider  atorvastatin  (LIPITOR) 40 MG tablet Take 1 tablet (40 mg total) by mouth daily. 02/11/24  Yes Marylynn Verneita CROME, MD  DULoxetine  (CYMBALTA ) 30 MG capsule Take 1 capsule (30 mg total) by mouth daily. 02/11/24  Yes Marylynn Verneita CROME, MD  gabapentin  (NEURONTIN ) 400 MG capsule Take 1 capsule (400 mg total) by mouth at bedtime. 11/08/23  Yes Jimmy Charlie FERNS, MD  glipiZIDE   (GLUCOTROL  XL) 10 MG 24 hr tablet Take 1 tablet (10 mg total) by mouth daily. 05/10/23  Yes Jimmy Charlie FERNS, MD  levothyroxine  (SYNTHROID ) 88 MCG tablet Take 1 tablet (88 mcg total) by mouth daily. 01/07/24  Yes Marylynn Verneita CROME, MD  Melatonin 12 MG TABS 17 mg at night 01/07/24  Yes Marylynn Verneita CROME, MD   Allergies  Allergen Reactions   Latex Swelling   Codeine Hives, Itching and Nausea And Vomiting   Sulfa Antibiotics Hives, Itching and Nausea And Vomiting    FAMILY HISTORY:  family history includes Alcohol abuse in her father; Arthritis in her mother; Bladder Cancer in her brother; COPD in her brother; Cancer in her brother and father; Diabetes in her sister and sister; Early death in her mother; Heart attack in her maternal grandmother and paternal grandfather; Heart disease in her sister; Heart failure in her sister; Hodgkin's lymphoma in her brother; Lupus in her mother; Miscarriages / Stillbirths in her daughter. SOCIAL HISTORY:  reports that she quit smoking about 10 years ago. Her smoking use included cigarettes. She started smoking about 53 years ago. She has a 32.3 pack-year smoking history. She has been exposed to tobacco smoke. She has never used smokeless tobacco. She reports that she does not currently use alcohol. She reports that she does not currently use drugs.   Review of Systems:  Gen:  Denies  fever, sweats, chills weight loss  HEENT: Denies blurred vision, double vision, ear pain, eye pain, hearing loss, nose bleeds, sore throat Cardiac:  No dizziness, chest pain or  heaviness, chest tightness,edema, No JVD Resp:   No cough, -sputum production, -shortness of breath,-wheezing, -hemoptysis,  Gi: Denies swallowing difficulty, stomach pain, nausea or vomiting, diarrhea, constipation, bowel incontinence Gu:  Denies bladder incontinence, burning urine Ext:   Denies Joint pain, stiffness or swelling Skin: Denies  skin rash, easy bruising or bleeding or hives Endoc:  Denies  polyuria, polydipsia , polyphagia or weight change Psych:   Denies depression, insomnia or hallucinations  Other:  All other systems negative  VITAL SIGNS: BP 120/80   Pulse 86   Temp 98.2 F (36.8 C)   Ht 5' 2 (1.575 m)   Wt 162 lb 9.6 oz (73.8 kg)   SpO2 97%   BMI 29.74 kg/m    Physical Examination:   General Appearance: No distress  EYES PERRLA, EOM intact.   NECK Supple, No JVD Pulmonary: normal breath sounds, No wheezing.  CardiovascularNormal S1,S2.  No m/r/g.   Abdomen: Benign, Soft, non-tender. Skin:   warm, no rashes, no ecchymosis  Extremities: normal, no cyanosis, clubbing. Neuro:without focal findings,  speech normal  PSYCHIATRIC: Mood, affect within normal limits.   ASSESSMENT AND PLAN  OSA Reviewed HST results with patient. Starting on APAP therapy set to 4-16 cmH2O. Discussed the consequences of untreated sleep apnea. Advised not to drive drowsy for safety of patient and others. Will follow up in 3 months.    DMII Stable, on current management. Following with PCP.    Patient  satisfied with Plan of action and management. All questions answered  I spent a total of 36 minutes reviewing chart data, face-to-face evaluation with the patient, counseling and coordination of care as detailed above.    Virginia Floyd, M.D.  Sleep Medicine [facility]

## 2024-03-13 NOTE — Patient Instructions (Signed)

## 2024-03-14 ENCOUNTER — Encounter: Payer: Self-pay | Admitting: Internal Medicine

## 2024-03-14 NOTE — Telephone Encounter (Signed)
 Spoke with pt to let her know that we still have not received the culture results yet but as soon as we do we will let her know of the results. I asked pt what she meant by feeling off, she stated that she started some new medications and since has noticed a little more fatigue and some headaches. Pt stated that she isn't sure if it is from the new medications or from the abnormal urine.

## 2024-03-16 ENCOUNTER — Ambulatory Visit: Payer: Self-pay | Admitting: Internal Medicine

## 2024-03-16 DIAGNOSIS — F5104 Psychophysiologic insomnia: Secondary | ICD-10-CM

## 2024-03-16 LAB — URINE CULTURE

## 2024-03-16 MED ORDER — CIPROFLOXACIN HCL 250 MG PO TABS: 250.0000 mg | ORAL_TABLET | Freq: Two times a day (BID) | ORAL | 0 refills | Status: AC

## 2024-04-03 NOTE — Addendum Note (Signed)
 Addended by: ANICE BELT on: 04/03/2024 12:59 PM   Modules accepted: Orders

## 2024-04-04 ENCOUNTER — Other Ambulatory Visit: Payer: Self-pay | Admitting: Internal Medicine

## 2024-04-04 DIAGNOSIS — E1159 Type 2 diabetes mellitus with other circulatory complications: Secondary | ICD-10-CM

## 2024-04-04 MED ORDER — RYBELSUS 7 MG PO TABS: 7.0000 mg | ORAL_TABLET | Freq: Every day | ORAL | 0 refills | Status: DC

## 2024-04-04 MED ORDER — SOLIFENACIN SUCCINATE 5 MG PO TABS: 5.0000 mg | ORAL_TABLET | Freq: Every day | ORAL | 3 refills | Status: AC

## 2024-04-04 MED ORDER — GABAPENTIN 400 MG PO CAPS: 400.0000 mg | ORAL_CAPSULE | Freq: Every day | ORAL | 3 refills | Status: AC

## 2024-04-04 MED ORDER — ATORVASTATIN CALCIUM 40 MG PO TABS: 40.0000 mg | ORAL_TABLET | Freq: Every day | ORAL | 3 refills | Status: AC

## 2024-04-04 MED ORDER — DULOXETINE HCL 30 MG PO CPEP: 30.0000 mg | ORAL_CAPSULE | Freq: Every day | ORAL | 3 refills | Status: AC

## 2024-04-04 MED ORDER — SITAGLIPTIN PHOSPHATE 25 MG PO TABS: 25.0000 mg | ORAL_TABLET | Freq: Every day | ORAL | 3 refills | Status: DC

## 2024-04-04 MED ORDER — TRAZODONE HCL 50 MG PO TABS: 50.0000 mg | ORAL_TABLET | Freq: Every day | ORAL | 1 refills | Status: AC

## 2024-04-04 MED ORDER — LEVOTHYROXINE SODIUM 88 MCG PO TABS: 88.0000 ug | ORAL_TABLET | Freq: Every day | ORAL | 3 refills | Status: AC

## 2024-04-04 MED ORDER — GLIPIZIDE 10 MG PO TABS: 10.0000 mg | ORAL_TABLET | Freq: Two times a day (BID) | ORAL | 3 refills | Status: AC

## 2024-04-04 NOTE — Addendum Note (Signed)
 Addended by: MARYLYNN VERNEITA CROME on: 04/04/2024 08:49 PM   Modules accepted: Orders

## 2024-04-07 ENCOUNTER — Telehealth: Payer: Self-pay

## 2024-04-07 NOTE — Telephone Encounter (Signed)
 Medication has been canceled at pharmacy and medication list has been updated.

## 2024-04-07 NOTE — Addendum Note (Signed)
 Addended by: HARRIETTE RAISIN on: 04/07/2024 03:43 PM   Modules accepted: Orders

## 2024-04-07 NOTE — Telephone Encounter (Signed)
 Spoke with pt and she stated that she has been taking glipizide  for several years now and had no side effects to the medication. Pharmacy is aware. Pt also wanted to let Dr. Marylynn know that she will be stopping the Rybelsus  due to side effect of headache. Pt stated that she has ordered the Januvia  to take instead.

## 2024-04-07 NOTE — Telephone Encounter (Signed)
 Copied from CRM [id number]. Topic: Clinical - Medication Question >> Apr 07, 2024 11:43 AM Chasity T wrote: Reason for CRM: Sari from [facility] pharmacy is calling to see if patient is able to tolerate medication glipiZIDE  (GLUCOTROL ) 10 MG tablet due to her allergy. Please call back to discuss  Phone number: [phone]

## 2024-04-12 ENCOUNTER — Ambulatory Visit (INDEPENDENT_AMBULATORY_CARE_PROVIDER_SITE_OTHER): Admitting: Internal Medicine

## 2024-04-12 ENCOUNTER — Encounter: Payer: Self-pay | Admitting: Internal Medicine

## 2024-04-12 ENCOUNTER — Ambulatory Visit: Admitting: Sleep Medicine

## 2024-04-12 VITALS — BP 118/56 | HR 81 | Temp 97.8°F | Ht 62.0 in | Wt 152.2 lb

## 2024-04-12 DIAGNOSIS — Z7984 Long term (current) use of oral hypoglycemic drugs: Secondary | ICD-10-CM | POA: Diagnosis not present

## 2024-04-12 DIAGNOSIS — G4733 Obstructive sleep apnea (adult) (pediatric): Secondary | ICD-10-CM | POA: Diagnosis not present

## 2024-04-12 DIAGNOSIS — E1159 Type 2 diabetes mellitus with other circulatory complications: Secondary | ICD-10-CM | POA: Diagnosis not present

## 2024-04-12 DIAGNOSIS — R350 Frequency of micturition: Secondary | ICD-10-CM | POA: Diagnosis not present

## 2024-04-12 LAB — URINALYSIS, ROUTINE W REFLEX MICROSCOPIC
Bilirubin Urine: NEGATIVE
Hgb urine dipstick: NEGATIVE
Ketones, ur: NEGATIVE
Leukocytes,Ua: NEGATIVE
Nitrite: NEGATIVE
RBC / HPF: NONE SEEN
Specific Gravity, Urine: 1.025 (ref 1.000–1.030)
Total Protein, Urine: NEGATIVE
Urine Glucose: NEGATIVE
Urobilinogen, UA: 0.2 (ref 0.0–1.0)
pH: 6 (ref 5.0–8.0)

## 2024-04-12 NOTE — Assessment & Plan Note (Addendum)
 Adding januvia  to glipizde xL and metformin  . Did not continue Rybelsus   due to adverse effects of headaches, constipation and dizziness .  Headaches have resolved

## 2024-04-12 NOTE — Progress Notes (Signed)
 "  Subjective:  Patient ID: Virginia Floyd, female    DOB: [birth year]  Age: 69 y.o. MRN: [id number]  CC: The primary encounter diagnosis was Type 2 diabetes mellitus with other circulatory complication, without long-term current use of insulin (HCC). Diagnoses of Urinary frequency and OSA (obstructive sleep apnea) were also pertinent to this visit.   HPI Virginia Floyd presents for No chief complaint on file.   Virginia Floyd is a 69 yr old female wit htype 2 DM,  recently diagnosed OSA. 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  Autotitrating CPAP was prescribed by Dr Jess.However patient states that she tried CPAP for 7 days but discontinued use of CPAP, due to dry mouth, claustrophobia, frequent waking due to intolerance of straps on her head .  She received reports that her mask was not fitting properly,  but was unable to receive assistance from the local office when she contacted them for  follow up at [facility].    The mask she was fitted for was the nasal pillows.   She is not willing to try again even with an anxiolytic  she has returned the equipment . She has severe TMJ and does not think she would tolerate the mandibular apparatus as an alternative since she is using the occlusive device. She is considering INSPIRA procedure offered by 2 local specialists  Virginia Floyd  Virginia Floyd and Virginia Floyd, (ENT  [facility])   2( type 2 DM  Lab Results  Component Value Date   HGBA1C 7.7 (H) 02/09/2024     Outpatient Medications Prior to Visit  Medication Sig Dispense Refill   atorvastatin  (LIPITOR) 40 MG tablet Take 1 tablet (40 mg total) by mouth daily. 90 tablet 3   DULoxetine  (CYMBALTA ) 30 MG capsule Take 1 capsule (30 mg total) by mouth daily. 90 capsule 3   gabapentin  (NEURONTIN ) 400 MG capsule Take 1 capsule (400 mg total) by mouth at bedtime. 90 capsule 3   glipiZIDE  (GLUCOTROL ) 10 MG tablet Take 1  tablet (10 mg total) by mouth 2 (two) times daily before a meal. 180 tablet 3   levothyroxine  (SYNTHROID ) 88 MCG tablet Take 1 tablet (88 mcg total) by mouth daily. 90 tablet 3   metFORMIN  (GLUCOPHAGE ) 500 MG tablet Take 1,000 mg by mouth 2 (two) times daily with a meal. (Patient not taking: Reported on 04/12/2024)     sitaGLIPtin  (JANUVIA ) 25 MG tablet Take 1 tablet (25 mg total) by mouth daily. 90 tablet 3   solifenacin  (VESICARE ) 5 MG tablet Take 1 tablet (5 mg total) by mouth daily. 90 tablet 3   traZODone  (DESYREL ) 50 MG tablet Take 1 tablet (50 mg total) by mouth at bedtime. 90 tablet 1   No facility-administered medications prior to visit.    Review of Systems;  Patient denies headache, fevers, malaise, unintentional weight loss, skin rash, eye pain, sinus congestion and sinus pain, sore throat, dysphagia,  hemoptysis , cough, dyspnea, wheezing, chest pain, palpitations, orthopnea, edema, abdominal pain, nausea, melena, diarrhea, constipation, flank pain, dysuria, hematuria, urinary  Frequency, nocturia, numbness, tingling, seizures,  Focal weakness, Loss of consciousness,  Tremor, insomnia, depression, anxiety, and suicidal ideation.      Objective:  BP (!) 118/56 (BP Location: Left Arm)   Pulse 81   Temp 97.8 F (36.6 C)   Ht 5' 2 (1.575 m)   Wt 152 lb 3.2 oz (69 kg)  SpO2 94%   BMI 27.84 kg/m   BP Readings from Last 3 Encounters:  04/12/24 (!) 118/56  03/13/24 120/80  03/09/24 134/64    Wt Readings from Last 3 Encounters:  04/12/24 152 lb 3.2 oz (69 kg)  03/13/24 162 lb 9.6 oz (73.8 kg)  03/09/24 163 lb 12.8 oz (74.3 kg)    Physical Exam Vitals reviewed.  Constitutional:      General: She is not in acute distress.    Appearance: Normal appearance. She is normal weight. She is not ill-appearing, toxic-appearing or diaphoretic.  HENT:     Head: Normocephalic.  Eyes:     General: No scleral icterus.       Right eye: No discharge.        Left eye: No discharge.      Conjunctiva/sclera: Conjunctivae normal.  Cardiovascular:     Rate and Rhythm: Normal rate and regular rhythm.     Heart sounds: Normal heart sounds.  Pulmonary:     Effort: Pulmonary effort is normal. No respiratory distress.     Breath sounds: Normal breath sounds.  Musculoskeletal:        General: Normal range of motion.  Skin:    General: Skin is warm and dry.  Neurological:     General: No focal deficit present.     Mental Status: She is alert and oriented to person, place, and time. Mental status is at baseline.  Psychiatric:        Mood and Affect: Mood normal.        Behavior: Behavior normal.        Thought Content: Thought content normal.        Judgment: Judgment normal.     Lab Results  Component Value Date   HGBA1C 7.7 (H) 02/09/2024   HGBA1C 8.2 (A) 11/08/2023   HGBA1C 6.8 (A) 10/15/2022    Lab Results  Component Value Date   CREATININE 0.65 02/09/2024   CREATININE 0.62 01/05/2024   CREATININE 0.68 06/10/2023    Lab Results  Component Value Date   WBC 10.3 05/16/2023   HGB 14.7 05/16/2023   HCT 43.9 05/16/2023   PLT 208 05/16/2023   GLUCOSE 181 (H) 02/09/2024   CHOL 137 02/09/2024   TRIG 158.0 (H) 02/09/2024   HDL 39.20 02/09/2024   LDLDIRECT 106.0 03/02/2022   LDLCALC 66 02/09/2024   ALT 27 02/09/2024   AST 18 02/09/2024   NA 144 02/09/2024   K 4.4 02/09/2024   CL 102 02/09/2024   CREATININE 0.65 02/09/2024   BUN 13 02/09/2024   CO2 34 (H) 02/09/2024   TSH 2.13 02/25/2024   HGBA1C 7.7 (H) 02/09/2024   MICROALBUR <0.7 02/09/2024    Assessment & Plan:  .Type 2 diabetes mellitus with other circulatory complication, without long-term current use of insulin (HCC) Assessment & Plan: Adding januvia  to glipizde xL and metformin  . Did not continue Rybelsus   due to adverse effects of headaches, constipation and dizziness .  Headaches have resolved   Orders: -     Hemoglobin A1c; Future -     Comprehensive metabolic panel with GFR;  Future  Urinary frequency -     Urinalysis, Routine w reflex microscopic -     Urine Culture  OSA (obstructive sleep apnea) Assessment & Plan: MODERATE TO SEVERE BY STUDY DONE IN NOVEMBER ([facility], ORDERED BY DR REDDY). She is intolerant of CPAP after a 7 day trial and requesting alternative forms of treatment .  Not a candidate  for mandibular advancement device due to preexisting condition of TMJ.  Referring to Virginia Floyd for consideration of hypoglossal nerve stimulation       I spent 34 minutes on the day of this face to face encounter reviewing patient's  most recent visit with pulmonology, cardiology,   prior relevant surgical and non surgical procedures, recent  labs and imaging studies, counseling on weight management,  reviewing the assessment and plan with patient, and post visit ordering and reviewing of  diagnostics and therapeutics with patient  .   Follow-up: Return in about 8 weeks (around 06/06/2024).   Verneita LITTIE Kettering, MD "

## 2024-04-12 NOTE — Patient Instructions (Addendum)
 Regardig the diabetes:  Chjeck sugars once daily ar various times (fasting and post prandial)  If your fastings are not < 130 and post prandials (2hr post meal) are not < 160 in one month, increase the  Januvia  dose to 50 mg and let me know  I will talk to [facility] and local ENT about options for OSA treatment   Repeat A1c in February

## 2024-04-13 ENCOUNTER — Ambulatory Visit: Payer: Self-pay | Admitting: Internal Medicine

## 2024-04-13 LAB — URINE CULTURE
MICRO NUMBER:: 17437763
Result:: NO GROWTH
SPECIMEN QUALITY:: ADEQUATE

## 2024-04-13 NOTE — Assessment & Plan Note (Signed)
 MODERATE TO SEVERE BY STUDY DONE IN NOVEMBER ([facility], ORDERED BY DR REDDY). She is intolerant of CPAP after a 7 day trial and requesting alternative forms of treatment .  Not a candidate for mandibular advancement device due to preexisting condition of TMJ.  Referring to Vaughan Ricker for consideration of hypoglossal nerve stimulation

## 2024-04-18 ENCOUNTER — Encounter: Payer: Self-pay | Admitting: Internal Medicine

## 2024-04-26 ENCOUNTER — Other Ambulatory Visit (HOSPITAL_COMMUNITY): Payer: Self-pay

## 2024-05-03 ENCOUNTER — Other Ambulatory Visit: Payer: Self-pay | Admitting: Internal Medicine

## 2024-05-03 DIAGNOSIS — Z1231 Encounter for screening mammogram for malignant neoplasm of breast: Secondary | ICD-10-CM

## 2024-05-05 ENCOUNTER — Other Ambulatory Visit: Payer: Self-pay | Admitting: Internal Medicine

## 2024-05-05 DIAGNOSIS — E1159 Type 2 diabetes mellitus with other circulatory complications: Secondary | ICD-10-CM

## 2024-05-05 MED ORDER — SITAGLIPTIN PHOSPHATE 100 MG PO TABS: 100.0000 mg | ORAL_TABLET | Freq: Every day | ORAL | 1 refills | Status: AC

## 2024-05-05 NOTE — Assessment & Plan Note (Signed)
 INCREASING DOSE OF JANUVIA  TO 100 MG DAILY IN ADDITION TO MAXIMUM DOSE OF glipizde xL and metformin  . Did not continue Rybelsus   due to adverse effects of headaches, constipation and dizziness .  Headaches have resolved

## 2024-05-11 ENCOUNTER — Ambulatory Visit: Admission: RE | Admit: 2024-05-11 | Discharge: 2024-05-11 | Disposition: A | Source: Ambulatory Visit

## 2024-05-11 DIAGNOSIS — Z1231 Encounter for screening mammogram for malignant neoplasm of breast: Secondary | ICD-10-CM

## 2024-06-12 ENCOUNTER — Ambulatory Visit: Admitting: Sleep Medicine
